# Patient Record
Sex: Female | Born: 1960 | Race: White | Hispanic: No | Marital: Married | Smoking: Never smoker
Health system: Southern US, Community
[De-identification: ages and names within clinical notes are randomized; demographics above are authoritative.]

## PROBLEM LIST (undated history)

## (undated) DIAGNOSIS — M5030 Other cervical disc degeneration, unspecified cervical region: Secondary | ICD-10-CM

## (undated) DIAGNOSIS — E785 Hyperlipidemia, unspecified: Secondary | ICD-10-CM

## (undated) DIAGNOSIS — R002 Palpitations: Secondary | ICD-10-CM

## (undated) DIAGNOSIS — G894 Chronic pain syndrome: Secondary | ICD-10-CM

## (undated) DIAGNOSIS — K589 Irritable bowel syndrome without diarrhea: Secondary | ICD-10-CM

## (undated) DIAGNOSIS — Z872 Personal history of diseases of the skin and subcutaneous tissue: Secondary | ICD-10-CM

## (undated) DIAGNOSIS — F419 Anxiety disorder, unspecified: Secondary | ICD-10-CM

## (undated) DIAGNOSIS — R1310 Dysphagia, unspecified: Secondary | ICD-10-CM

## (undated) DIAGNOSIS — R0609 Other forms of dyspnea: Secondary | ICD-10-CM

## (undated) DIAGNOSIS — F411 Generalized anxiety disorder: Secondary | ICD-10-CM

## (undated) DIAGNOSIS — N200 Calculus of kidney: Secondary | ICD-10-CM

## (undated) DIAGNOSIS — F32A Depression, unspecified: Secondary | ICD-10-CM

## (undated) DIAGNOSIS — L501 Idiopathic urticaria: Secondary | ICD-10-CM

## (undated) DIAGNOSIS — K219 Gastro-esophageal reflux disease without esophagitis: Secondary | ICD-10-CM

## (undated) DIAGNOSIS — K921 Melena: Secondary | ICD-10-CM

## (undated) DIAGNOSIS — R51 Headache: Secondary | ICD-10-CM

## (undated) DIAGNOSIS — K317 Polyp of stomach and duodenum: Secondary | ICD-10-CM

## (undated) DIAGNOSIS — D509 Iron deficiency anemia, unspecified: Secondary | ICD-10-CM

## (undated) DIAGNOSIS — E88819 Insulin resistance, unspecified: Secondary | ICD-10-CM

## (undated) DIAGNOSIS — M1990 Unspecified osteoarthritis, unspecified site: Secondary | ICD-10-CM

## (undated) DIAGNOSIS — N390 Urinary tract infection, site not specified: Secondary | ICD-10-CM

## (undated) DIAGNOSIS — G4700 Insomnia, unspecified: Secondary | ICD-10-CM

## (undated) DIAGNOSIS — IMO0002 Reserved for concepts with insufficient information to code with codable children: Secondary | ICD-10-CM

## (undated) DIAGNOSIS — F329 Major depressive disorder, single episode, unspecified: Secondary | ICD-10-CM

## (undated) DIAGNOSIS — E538 Deficiency of other specified B group vitamins: Secondary | ICD-10-CM

## (undated) DIAGNOSIS — I499 Cardiac arrhythmia, unspecified: Secondary | ICD-10-CM

## (undated) DIAGNOSIS — G9050 Complex regional pain syndrome I, unspecified: Secondary | ICD-10-CM

## (undated) DIAGNOSIS — T50905A Adverse effect of unspecified drugs, medicaments and biological substances, initial encounter: Secondary | ICD-10-CM

## (undated) DIAGNOSIS — K769 Liver disease, unspecified: Secondary | ICD-10-CM

## (undated) DIAGNOSIS — E041 Nontoxic single thyroid nodule: Secondary | ICD-10-CM

## (undated) DIAGNOSIS — R519 Headache, unspecified: Secondary | ICD-10-CM

## (undated) DIAGNOSIS — E8881 Metabolic syndrome: Secondary | ICD-10-CM

## (undated) DIAGNOSIS — F4312 Post-traumatic stress disorder, chronic: Secondary | ICD-10-CM

## (undated) DIAGNOSIS — Z8541 Personal history of malignant neoplasm of cervix uteri: Secondary | ICD-10-CM

## (undated) HISTORY — DX: Melena: K92.1

## (undated) HISTORY — DX: Unspecified osteoarthritis, unspecified site: M19.90

## (undated) HISTORY — DX: Anxiety disorder, unspecified: F41.9

## (undated) HISTORY — PX: ABDOMINAL HYSTERECTOMY: SHX81

## (undated) HISTORY — DX: Metabolic syndrome: E88.81

## (undated) HISTORY — DX: Iron deficiency anemia, unspecified: D50.9

## (undated) HISTORY — DX: Deficiency of other specified B group vitamins: E53.8

## (undated) HISTORY — DX: Adverse effect of unspecified drugs, medicaments and biological substances, initial encounter: T50.905A

## (undated) HISTORY — DX: Generalized anxiety disorder: F41.1

## (undated) HISTORY — DX: Idiopathic urticaria: L50.1

## (undated) HISTORY — DX: Insulin resistance, unspecified: E88.819

## (undated) HISTORY — DX: Reserved for concepts with insufficient information to code with codable children: IMO0002

## (undated) HISTORY — DX: Other cervical disc degeneration, unspecified cervical region: M50.30

## (undated) HISTORY — DX: Dysphagia, unspecified: R13.10

## (undated) HISTORY — DX: Headache, unspecified: R51.9

## (undated) HISTORY — DX: Other forms of dyspnea: R06.09

## (undated) HISTORY — PX: UMBILICAL HERNIA REPAIR: SHX196

## (undated) HISTORY — DX: Urinary tract infection, site not specified: N39.0

## (undated) HISTORY — DX: Personal history of diseases of the skin and subcutaneous tissue: Z87.2

## (undated) HISTORY — PX: CHOLECYSTECTOMY: SHX55

## (undated) HISTORY — DX: Post-traumatic stress disorder, chronic: F43.12

## (undated) HISTORY — DX: Chronic pain syndrome: G89.4

## (undated) HISTORY — DX: Palpitations: R00.2

## (undated) HISTORY — DX: Polyp of stomach and duodenum: K31.7

## (undated) HISTORY — PX: CARDIOVASCULAR STRESS TEST: SHX262

## (undated) HISTORY — PX: NASAL SINUS SURGERY: SHX719

## (undated) HISTORY — DX: Hyperlipidemia, unspecified: E78.5

## (undated) HISTORY — DX: Major depressive disorder, single episode, unspecified: F32.9

## (undated) HISTORY — PX: TONSILLECTOMY: SUR1361

## (undated) HISTORY — DX: Headache: R51

## (undated) HISTORY — DX: Calculus of kidney: N20.0

## (undated) HISTORY — DX: Nontoxic single thyroid nodule: E04.1

## (undated) HISTORY — DX: Gastro-esophageal reflux disease without esophagitis: K21.9

## (undated) HISTORY — DX: Irritable bowel syndrome, unspecified: K58.9

## (undated) HISTORY — DX: Insomnia, unspecified: G47.00

## (undated) HISTORY — DX: Depression, unspecified: F32.A

---
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
## 2010-09-07 HISTORY — PX: COLONOSCOPY: SHX174

## 2011-04-08 HISTORY — PX: OTHER SURGICAL HISTORY: SHX169

## 2013-10-26 DIAGNOSIS — R197 Diarrhea, unspecified: Secondary | ICD-10-CM | POA: Insufficient documentation

## 2014-03-15 LAB — LIPID PANEL
Cholesterol: 315 mg/dL — AB (ref 0–200)
HDL: 45 mg/dL (ref 35–70)
LDL CALC: 222 mg/dL
Triglycerides: 239 mg/dL — AB (ref 40–160)

## 2014-03-15 LAB — CBC AND DIFFERENTIAL
HCT: 41 % (ref 36–46)
Hemoglobin: 12.9 g/dL (ref 12.0–16.0)
Neutrophils Absolute: 1790 /uL
Platelets: 330 10*3/uL (ref 150–399)
WBC: 4.8 10^3/mL

## 2014-03-15 LAB — BASIC METABOLIC PANEL
BUN: 13 mg/dL (ref 4–21)
Creatinine: 0.7 mg/dL (ref <=1.1)
Potassium: 4.2 mmol/L (ref 3.4–5.3)
Sodium: 137 mmol/L (ref 137–147)

## 2014-03-15 LAB — HEPATIC FUNCTION PANEL
ALK PHOS: 105 U/L (ref 25–125)
ALT: 26 U/L (ref 7–35)
AST: 21 U/L (ref 13–35)
BILIRUBIN, TOTAL: 0.4 mg/dL

## 2016-04-07 LAB — HM MAMMOGRAPHY

## 2016-10-26 ENCOUNTER — Ambulatory Visit (INDEPENDENT_AMBULATORY_CARE_PROVIDER_SITE_OTHER): Payer: 59 | Admitting: Family Medicine

## 2016-10-26 ENCOUNTER — Encounter: Payer: Self-pay | Admitting: Family Medicine

## 2016-10-26 VITALS — BP 129/78 | HR 78 | Temp 99.2°F | Resp 16 | Ht 67.0 in | Wt 183.0 lb

## 2016-10-26 DIAGNOSIS — R300 Dysuria: Secondary | ICD-10-CM

## 2016-10-26 DIAGNOSIS — Z634 Disappearance and death of family member: Secondary | ICD-10-CM | POA: Diagnosis not present

## 2016-10-26 DIAGNOSIS — F4329 Adjustment disorder with other symptoms: Secondary | ICD-10-CM

## 2016-10-26 DIAGNOSIS — F4321 Adjustment disorder with depressed mood: Secondary | ICD-10-CM

## 2016-10-26 DIAGNOSIS — N390 Urinary tract infection, site not specified: Secondary | ICD-10-CM

## 2016-10-26 LAB — POCT URINALYSIS DIPSTICK
Bilirubin, UA: NEGATIVE
Blood, UA: NEGATIVE
GLUCOSE UA: NEGATIVE
KETONES UA: NEGATIVE
Leukocytes, UA: NEGATIVE
Nitrite, UA: NEGATIVE
Protein, UA: NEGATIVE
SPEC GRAV UA: 1.015
Urobilinogen, UA: 1
pH, UA: 5.5

## 2016-10-26 MED ORDER — CIPROFLOXACIN HCL 500 MG PO TABS: 500.0000 mg | ORAL_TABLET | Freq: Two times a day (BID) | ORAL | 0 refills | Status: AC

## 2016-10-26 NOTE — Progress Notes (Signed)
Pre visit review using our clinic review tool, if applicable. No additional management support is needed unless otherwise documented below in the visit note. 

## 2016-10-26 NOTE — Progress Notes (Signed)
Office Note 10/26/2016  CC:  Chief Complaint  Patient presents with  . Establish Care  . Urinary Tract Infection    back pain, burning when urinating, urinary frequency, urinary retention x 1 week    HPI:  Tracy Olsen is a 56 y.o. female who is here to establish care and discuss urinary complaints. Patient's most recent primary MD: Dr. Margart Sickles in [place], [place].  Dr. Liz Malady, neurologist in [place]. Dr. Harl Bowie, cardiologist.  Old records were not reviewed prior to or during today's visit. Her primary chronic issue is complex regional pain syndrome.  She says she cannot tolerate any narcotic pain meds, so her neurologist has her on cymbalta and neurontin for this.    For about 1 week: dysuria, feeling of incomplete emptying, diffuse low back pain.  Mild urinary urgency. Says these are her usual UTI sx's. No vag d/c.  No abd pain.  No fever.  Also, pt says her father committed suicide 2 yrs ago and she found him.  She is still dealing with grief regarding this and asks for referral to counselor.  Past Medical History:  Diagnosis Date  . Arthritis   . Complex regional pain syndrome    onset with broken R wrist; pain in both arms  . Depression   . Frequent headaches    migraine synd  . GERD (gastroesophageal reflux disease)   . Hyperlipidemia   . Nephrolithiasis    never had to have one extracted or lithotripsy  . Palpitations    PVCs: no medical therpay recommended by her cardiologist.  . Recurrent UTI    approx 3 per year.    Past Surgical History:  Procedure Laterality Date  . ABDOMINAL HYSTERECTOMY  age 32   endometriosis; also history of "severely abnormal pap"  . CESAREAN SECTION    . CHOLECYSTECTOMY  age 71  . NASAL SINUS SURGERY  age 57  . TONSILLECTOMY  age 43  . UMBILICAL HERNIA REPAIR  age 22    Family History  Problem Relation Age of Onset  . Arthritis Mother   . Rheum arthritis Mother   . Heart disease Mother   . Hypertension  Mother   . Kidney disease Mother   . Raynaud syndrome Mother   . Multiple sclerosis Mother   . Lupus Mother   . Prostate cancer Father   . Heart disease Maternal Uncle   . Hypertension Maternal Uncle   . Hyperlipidemia Maternal Uncle   . Arthritis Maternal Grandmother   . Rheum arthritis Maternal Grandmother   . Kidney disease Maternal Grandmother   . Breast cancer Maternal Grandmother   . Heart disease Maternal Grandfather   . Diabetes Maternal Grandfather   . Heart disease Paternal Grandmother   . Hyperlipidemia Paternal Grandmother   . Kidney disease Maternal Uncle   . Prostate cancer Maternal Uncle     Social History   Social History  . Marital status: Married    Spouse name: N/A  . Number of children: N/A  . Years of education: N/A   Occupational History  . Not on file.   Social History Main Topics  . Smoking status: Never Smoker  . Smokeless tobacco: Never Used  . Alcohol use Yes     Comment: rarely  . Drug use: No  . Sexual activity: Not on file   Other Topics Concern  . Not on file   Social History Narrative   Married, 2 children (adults)   Educ: 4 yrs college.  Occ: housewife.   No tob/rarely alcohol.          Outpatient Encounter Prescriptions as of 10/26/2016  Medication Sig  . dexlansoprazole (DEXILANT) 60 MG capsule Take 60 mg by mouth daily.  . DULoxetine (CYMBALTA) 60 MG capsule Take 60 mg by mouth daily.  Marland Kitchen escitalopram (LEXAPRO) 10 MG tablet Take 10 mg by mouth daily.  Marland Kitchen gabapentin (NEURONTIN) 300 MG capsule Take 300 mg by mouth as needed.  Marland Kitchen LORazepam (ATIVAN) 2 MG tablet Take 2 mg by mouth at bedtime.  . simvastatin (ZOCOR) 20 MG tablet Take 20 mg by mouth daily.  . ciprofloxacin (CIPRO) 500 MG tablet Take 1 tablet (500 mg total) by mouth 2 (two) times daily.   No facility-administered encounter medications on file as of 10/26/2016.     Allergies  Allergen Reactions  . Codeine Anaphylaxis, Hives and Swelling  . Erythromycin Hives  and Swelling    Pt states that she can take zpak  . Penicillins Hives  . Sulfa Antibiotics Hives and Swelling  . Tramadol Hives   ROS Review of Systems  Constitutional: Negative for fatigue and fever.  HENT: Negative for congestion and sore throat.   Eyes: Negative for visual disturbance.  Respiratory: Negative for cough.   Cardiovascular: Negative for chest pain.  Gastrointestinal: Negative for abdominal pain and nausea.  Genitourinary:       See hpi  Musculoskeletal: Negative for back pain and joint swelling.  Skin: Negative for rash.  Neurological: Negative for weakness and headaches.  Hematological: Negative for adenopathy.  Psychiatric/Behavioral: Positive for dysphoric mood (see hpi). The patient is nervous/anxious (see hpi).     PE; Blood pressure 129/78, pulse 78, temperature 99.2 F (37.3 C), temperature source Oral, resp. rate 16, height 5\' 7"  (1.702 m), weight 183 lb (83 kg), SpO2 98 %.  Pt examined with Starla Link, CMA, as chaperone.  Gen: Alert, well appearing.  Patient is oriented to person, place, time, and situation. AFFECT: pleasant, lucid thought and speech. [id number]: no injection, icteris, swelling, or exudate.  EOMI, PERRLA. Mouth: lips without lesion/swelling.  Oral mucosa pink and moist. Oropharynx without erythema, exudate, or swelling.  Neck - No masses or thyromegaly or limitation in range of motion CV: RRR, no m/r/g.   LUNGS: CTA bilat, nonlabored resps, good aeration in all lung fields. ABD: soft, NT, ND, BS normal EXT: no clubbing, cyanosis, or edema.   Pertinent labs:  CC UA today: normal  ASSESSMENT AND PLAN:   New pt; obtain prior PCP and her neurologist's records.  1) UTI suspected: her urinalysis was normal, but she is adamant that these are her usual UTI sx's. Will start cipro 500 mg bid x 5d and sent urine for c/s.  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  Per her request today, will refer to counselor (psychologist Doroteo Glassman).  An After Visit Summary was printed and given to the patient.  Return in about 3 months (around 01/23/2017) for annual CPE (fasting).  Signed:  Crissie Sickles, MD           10/26/2016

## 2016-10-27 LAB — URINE CULTURE: ORGANISM ID, BACTERIA: NO GROWTH

## 2016-11-02 ENCOUNTER — Encounter: Payer: Self-pay | Admitting: Family Medicine

## 2016-11-02 ENCOUNTER — Telehealth: Payer: Self-pay | Admitting: *Deleted

## 2016-11-02 NOTE — Telephone Encounter (Signed)
Noted  

## 2016-11-02 NOTE — Telephone Encounter (Signed)
Received fax from [facility].  Records is on Dr. Isla Pence desk.

## 2016-11-06 ENCOUNTER — Encounter: Payer: Self-pay | Admitting: Family Medicine

## 2016-11-08 ENCOUNTER — Encounter: Payer: Self-pay | Admitting: Family Medicine

## 2016-12-28 ENCOUNTER — Encounter: Payer: 59 | Admitting: Family Medicine

## 2017-01-19 ENCOUNTER — Encounter: Payer: Self-pay | Admitting: *Deleted

## 2017-01-19 DIAGNOSIS — F32A Depression, unspecified: Secondary | ICD-10-CM | POA: Insufficient documentation

## 2017-01-19 DIAGNOSIS — F329 Major depressive disorder, single episode, unspecified: Secondary | ICD-10-CM | POA: Insufficient documentation

## 2017-01-20 ENCOUNTER — Encounter: Payer: Self-pay | Admitting: Family Medicine

## 2017-01-20 ENCOUNTER — Ambulatory Visit (INDEPENDENT_AMBULATORY_CARE_PROVIDER_SITE_OTHER): Payer: 59 | Admitting: Family Medicine

## 2017-01-20 VITALS — BP 106/72 | HR 81 | Temp 98.6°F | Resp 16 | Ht 67.0 in | Wt 189.2 lb

## 2017-01-20 DIAGNOSIS — R5382 Chronic fatigue, unspecified: Secondary | ICD-10-CM

## 2017-01-20 DIAGNOSIS — F32A Depression, unspecified: Secondary | ICD-10-CM

## 2017-01-20 DIAGNOSIS — F419 Anxiety disorder, unspecified: Secondary | ICD-10-CM | POA: Diagnosis not present

## 2017-01-20 DIAGNOSIS — Z9189 Other specified personal risk factors, not elsewhere classified: Secondary | ICD-10-CM | POA: Diagnosis not present

## 2017-01-20 DIAGNOSIS — G90511 Complex regional pain syndrome I of right upper limb: Secondary | ICD-10-CM

## 2017-01-20 DIAGNOSIS — E7800 Pure hypercholesterolemia, unspecified: Secondary | ICD-10-CM | POA: Diagnosis not present

## 2017-01-20 DIAGNOSIS — F5105 Insomnia due to other mental disorder: Secondary | ICD-10-CM

## 2017-01-20 DIAGNOSIS — F329 Major depressive disorder, single episode, unspecified: Secondary | ICD-10-CM | POA: Diagnosis not present

## 2017-01-20 DIAGNOSIS — Z1159 Encounter for screening for other viral diseases: Secondary | ICD-10-CM | POA: Diagnosis not present

## 2017-01-20 LAB — CBC WITH DIFFERENTIAL/PLATELET
BASOS PCT: 2.2 % (ref 0.0–3.0)
Basophils Absolute: 0.1 10*3/uL (ref 0.0–0.1)
EOS ABS: 0.4 10*3/uL (ref 0.0–0.7)
Eosinophils Relative: 7.2 % — ABNORMAL HIGH (ref 0.0–5.0)
HEMATOCRIT: 37.9 % (ref 36.0–46.0)
Hemoglobin: 12.4 g/dL (ref 12.0–15.0)
LYMPHS PCT: 46.8 % — AB (ref 12.0–46.0)
Lymphs Abs: 2.5 10*3/uL (ref 0.7–4.0)
MCHC: 32.8 g/dL (ref 30.0–36.0)
MCV: 81.7 fl (ref 78.0–100.0)
MONOS PCT: 8.9 % (ref 3.0–12.0)
Monocytes Absolute: 0.5 10*3/uL (ref 0.1–1.0)
NEUTROS ABS: 1.8 10*3/uL (ref 1.4–7.7)
Neutrophils Relative %: 34.9 % — ABNORMAL LOW (ref 43.0–77.0)
PLATELETS: 411 10*3/uL — AB (ref 150.0–400.0)
RBC: 4.65 Mil/uL (ref 3.87–5.11)
RDW: 16 % — AB (ref 11.5–15.5)
WBC: 5.3 10*3/uL (ref 4.0–10.5)

## 2017-01-20 LAB — COMPREHENSIVE METABOLIC PANEL
ALK PHOS: 88 U/L (ref 39–117)
ALT: 25 U/L (ref 0–35)
AST: 23 U/L (ref 0–37)
Albumin: 4.4 g/dL (ref 3.5–5.2)
BILIRUBIN TOTAL: 0.3 mg/dL (ref 0.2–1.2)
BUN: 14 mg/dL (ref 6–23)
CHLORIDE: 108 meq/L (ref 96–112)
CO2: 26 mEq/L (ref 19–32)
Calcium: 9.7 mg/dL (ref 8.4–10.5)
Creatinine, Ser: 0.63 mg/dL (ref 0.40–1.20)
GFR: 103.92 mL/min (ref >60.00)
Glucose, Bld: 96 mg/dL (ref 70–99)
Potassium: 4.2 mEq/L (ref 3.5–5.1)
SODIUM: 141 meq/L (ref 135–145)
Total Protein: 6.6 g/dL (ref 6.0–8.3)

## 2017-01-20 LAB — HEMOGLOBIN A1C: Hgb A1c MFr Bld: 5.9 % (ref 4.6–6.5)

## 2017-01-20 LAB — LIPID PANEL
Cholesterol: 201 mg/dL — ABNORMAL HIGH (ref 0–200)
HDL: 49.7 mg/dL (ref >39.00)
LDL CALC: 132 mg/dL — AB (ref 0–99)
NONHDL: 151.54
Total CHOL/HDL Ratio: 4
Triglycerides: 100 mg/dL (ref 0.0–149.0)
VLDL: 20 mg/dL (ref 0.0–40.0)

## 2017-01-20 LAB — TSH: TSH: 1.26 u[IU]/mL (ref 0.35–4.50)

## 2017-01-20 MED ORDER — SIMVASTATIN 20 MG PO TABS
20.0000 mg | ORAL_TABLET | Freq: Every day | ORAL | 6 refills | Status: DC
Start: 2017-01-20 — End: 2017-11-10

## 2017-01-20 NOTE — Patient Instructions (Signed)
Increase your gabapentin to 300 mg in the morning, 300 mg in the early afternoon, and 600 mg at bedtime.  Sorry you are feeling so bad. I'll be praying for your mom.

## 2017-01-20 NOTE — Progress Notes (Signed)
OFFICE VISIT  01/20/2017   CC:  Chief Complaint  Patient presents with  . Fatigue  . Follow-up    RCI, pt is fasting, would like to have labs done   HPI:    Patient is a 56 y.o. female who presents for fatigue. Fatigue is "rediculous", worse the last month or so since mom ill/at [facility], having much worse sx's of her CRPS, having HAs, feels very stressed.  Interestingly she says her depression is not any worse--but this is still a significant problem. She is staying in [place] with her children.  Spends a lot of time in hospital with her mom--about 6 hours.  Hands hurt bad when she pushes her wheelchair--she attributes this to her CRPS. She recently came back home to give herself a break. Taking all meds as prescribed.   She self increased her neurontin to 400-500 gabapentin qhs.  She feels no side effects from this med. Has noted no improvement in the 3 wks on increased dose.  She did get established with Theodosia Paling, counselor, but has not been able to f/u due to her issues with her mom.  Pt snores ever since sinus surgery she had, but no apneic events in sleep have been witnessed. No excessive daytime sleepiness.  Feels exhausted but can't sleep. + Polydipsia but no polyuria.  Past Medical History:  Diagnosis Date  . Arthritis   . Chronic post-traumatic stress disorder (PTSD)    + persistent complicated bereavement disorder (Dr. Doroteo Glassman, PhD, psychology).  . Complex regional pain syndrome    onset with broken R wrist; pain in both arms  . Depression    anx and dep  . Frequent headaches    migraine synd  . GERD (gastroesophageal reflux disease)   . History of cellulitis    with abscess or oral soft tissues  . Hyperlipidemia   . IBS (irritable bowel syndrome)    Diarrhea  . Insomnia   . Insulin resistance   . Nephrolithiasis    never had to have one extracted or lithotripsy  . Palpitations    PVCs: no medical therpay recommended by her cardiologist in [place],  [place].  Marland Kitchen Recurrent UTI    approx 3 per year.    Past Surgical History:  Procedure Laterality Date  . ABDOMINAL HYSTERECTOMY  age 50   endometriosis; also history of "severely abnormal pap"  . CESAREAN SECTION     X 2  . CHOLECYSTECTOMY  age 19  . COLONOSCOPY  2012   Normal  . LE venous doppler U/s  04/2011   No DVT  . NASAL SINUS SURGERY  age 4  . TONSILLECTOMY  age 62  . UMBILICAL HERNIA REPAIR  age 37    Outpatient Medications Prior to Visit  Medication Sig Dispense Refill  . dexlansoprazole (DEXILANT) 60 MG capsule Take 60 mg by mouth daily.    . DULoxetine (CYMBALTA) 60 MG capsule Take 60 mg by mouth daily.    Marland Kitchen LORazepam (ATIVAN) 2 MG tablet Take 2 mg by mouth at bedtime.    . simvastatin (ZOCOR) 20 MG tablet Take 20 mg by mouth daily.    Marland Kitchen escitalopram (LEXAPRO) 10 MG tablet Take 10 mg by mouth daily.    Marland Kitchen gabapentin (NEURONTIN) 300 MG capsule Take 300 mg by mouth as needed.     No facility-administered medications prior to visit.     Allergies  Allergen Reactions  . Codeine Anaphylaxis, Hives and Swelling  . Erythromycin Hives and Swelling  Pt states that she can take zpak  . Penicillins Hives  . Percocet [Oxycodone-Acetaminophen] Other (See Comments)    unknown  . Sulfa Antibiotics Hives and Swelling  . Tramadol Hives  . Vicodin [Hydrocodone-Acetaminophen] Other (See Comments)    unknown    ROS As per HPI  PE: Blood pressure 106/72, pulse 81, temperature 98.6 F (37 C), temperature source Oral, resp. rate 16, height 5\' 7"  (1.702 m), weight 189 lb 4 oz (85.8 kg), SpO2 95 %. Body mass index is 29.64 kg/m.  Gen: Alert, well appearing.  Patient is oriented to person, place, time, and situation. AFFECT: pleasant but slightly flat, lucid thought and speech. GNF:AOZH: no injection, icteris, swelling, or exudate.  EOMI, PERRLA. Mouth: lips without lesion/swelling.  Oral mucosa pink and moist. Oropharynx without erythema, exudate, or swelling.  CV: RRR, no  m/r/g.   LUNGS: CTA bilat, nonlabored resps, good aeration in all lung fields. EXT: no clubbing, cyanosis, or edema.  MUSCULO: mild diffuse TTP in soft tissues of back, also bilat shoulders, and R arm diffusely. Also TTP in both wrists and hands.  No joint swelling, erythema, or warmth. No rash.  No TTP of hips or lower extremities.  LABS:   Lab Results  Component Value Date   WBC 4.8 03/15/2014   HGB 12.9 03/15/2014   HCT 41 03/15/2014   PLT 330 03/15/2014   Lab Results  Component Value Date   CREATININE 0.7 03/15/2014   BUN 13 03/15/2014   NA 137 03/15/2014   K 4.2 03/15/2014   Lab Results  Component Value Date   ALT 26 03/15/2014   AST 21 03/15/2014   ALKPHOS 105 03/15/2014   Lab Results  Component Value Date   CHOL 315 (A) 03/15/2014   Lab Results  Component Value Date   HDL 45 03/15/2014   Lab Results  Component Value Date   LDLCALC 222 03/15/2014   Lab Results  Component Value Date   TRIG 239 (A) 03/15/2014    IMPRESSION AND PLAN:  Fatigue: multifactorial (anxiety, depression, worsened chronic complex regional pain syndrome symptoms in shoulder and arm, insomnia). Plan: increase gabapentin to 300 mg qAM, 300 mg q afternoon, and 600 mg qhs. Hopefully this will help some with insomnia.  Her dose of ativan at bedtime is already maxed out. CBC, CMET, TSH, HbA1c, Hep C screening today. She'll consider PT in future once her life settles down and her mom's health is improved.  An After Visit Summary was printed and given to the patient.  Spent 25 min with pt today, with >50% of this time spent in counseling and care coordination regarding the above problems.  FOLLOW UP: Return in about 4 weeks (around 02/17/2017) for f/u fatigue, CRPS, insomnia.  Signed:  Crissie Sickles, MD           01/20/2017

## 2017-01-21 ENCOUNTER — Encounter: Payer: Self-pay | Admitting: *Deleted

## 2017-01-21 LAB — HEPATITIS C ANTIBODY: HCV AB: NEGATIVE

## 2017-02-08 ENCOUNTER — Other Ambulatory Visit: Payer: Self-pay | Admitting: Family Medicine

## 2017-02-08 MED ORDER — LORAZEPAM 2 MG PO TABS: 2.0000 mg | ORAL_TABLET | Freq: Every day | ORAL | 5 refills | Status: DC

## 2017-02-08 NOTE — Telephone Encounter (Signed)
Patient notified that prescription was faxed to pharmacy. Verbalized understanding.

## 2017-02-08 NOTE — Telephone Encounter (Signed)
RF request for lorazepam LOV: 01/20/17 Next ov: None Last written: unknown, new pt  Please advise. Thanks.   _______________________________  Suzzanne Cloud at [facility] and he stated that pt still has refills for her simvastatin. Pt will need to contact pharmacy for refills.

## 2017-02-08 NOTE — Telephone Encounter (Signed)
Spoke with patient, explained to her that Dr. Anitra Lauth has to approve medication before being sent to pharmacy. Patient was informed that we would call her as soon as decision was made.

## 2017-02-08 NOTE — Telephone Encounter (Signed)
Patient called back to get Lorazepam Rx. Please call her

## 2017-02-08 NOTE — Telephone Encounter (Signed)
Patient requesting refills of the following:  simvastatin (ZOCOR) 20 MG tablet  LORazepam (ATIVAN) 2 MG tablet  Patient will be leaving tomorrow early tomorrow morning heading to [facility] to help take care of her mother.  She needs to pick scripts up from pharmacy today or early tomorrow morning if possible.  Pharmacy:  [id number] - [place], [place] [phone] (Phone) [phone] (Fax)

## 2017-02-09 ENCOUNTER — Telehealth: Payer: Self-pay | Admitting: Family Medicine

## 2017-02-09 MED ORDER — LORAZEPAM 2 MG PO TABS: ORAL_TABLET | ORAL | 5 refills | Status: DC

## 2017-02-09 NOTE — Telephone Encounter (Signed)
Patient states that she is taking lorazepam 2mg  2 tabs at bedtime not just one tab so quantity should be # 60.  Can this please be changed?  Please advise.

## 2017-02-09 NOTE — Telephone Encounter (Signed)
Patient notified via phone call that correction was made and pharmacy was notified as well.

## 2017-02-09 NOTE — Telephone Encounter (Signed)
Reviewed  controlled substance reporting system and the patient's report is accurate/compatible with past rx's.  Most recently dispensed 01/09/17, rx'd by NP Amy Hopkins at her prior PCP office in [place], [place]. Will change rx to lorazepam 2mg , 2 tabs po qhs, #60, RF x 5.  Signed:  Crissie Sickles, MD           02/09/2017

## 2017-03-16 ENCOUNTER — Ambulatory Visit: Payer: 59 | Admitting: Family Medicine

## 2017-03-18 ENCOUNTER — Encounter: Payer: Self-pay | Admitting: Family Medicine

## 2017-03-18 ENCOUNTER — Ambulatory Visit (INDEPENDENT_AMBULATORY_CARE_PROVIDER_SITE_OTHER): Payer: 59 | Admitting: Family Medicine

## 2017-03-18 VITALS — BP 114/74 | HR 81 | Temp 98.1°F | Resp 16 | Ht 67.0 in | Wt 189.5 lb

## 2017-03-18 DIAGNOSIS — Z9189 Other specified personal risk factors, not elsewhere classified: Secondary | ICD-10-CM

## 2017-03-18 DIAGNOSIS — B882 Other arthropod infestations: Secondary | ICD-10-CM | POA: Diagnosis not present

## 2017-03-18 MED ORDER — DOXYCYCLINE HYCLATE 100 MG PO CAPS: 100.0000 mg | ORAL_CAPSULE | Freq: Two times a day (BID) | ORAL | 0 refills | Status: AC

## 2017-03-18 NOTE — Progress Notes (Signed)
OFFICE VISIT  03/18/2017   CC:  Chief Complaint  Patient presents with  . Tick Removal    has had joint pain and has not been feeling well   HPI:    Patient is a 56 y.o.  female who presents for recent tick bite.  Unknown tick type. Found tick 3 days ago, unsure how long it had been there. About the same time she noted achy diffusely in muscles and joints, fatigue, some groin soreness, some HAs, no fevers or rash at tick bite site or elsewhere.  The confounding factor here is that she feels many of these sx's (pain and fatigue) on chronic basis---but she feels like they were worse than normal.  ROS: no n/v/d, no ST or URI sx's, no cough.  No abd pain.  NO CP, palpitations, or dizziness.  Past Medical History:  Diagnosis Date  . Arthritis   . Chronic post-traumatic stress disorder (PTSD)    + persistent complicated bereavement disorder (Dr. Doroteo Glassman, PhD, psychology).  . Complex regional pain syndrome    onset with broken R wrist; pain in both arms  . Depression    anx and dep  . Frequent headaches    migraine synd  . GERD (gastroesophageal reflux disease)   . History of cellulitis    with abscess or oral soft tissues  . Hyperlipidemia   . IBS (irritable bowel syndrome)    Diarrhea  . Insomnia   . Insulin resistance   . Nephrolithiasis    never had to have one extracted or lithotripsy  . Palpitations    PVCs: no medical therpay recommended by her cardiologist in [place], [place].  Marland Kitchen Recurrent UTI    approx 3 per year.    Past Surgical History:  Procedure Laterality Date  . ABDOMINAL HYSTERECTOMY  age 25   endometriosis; also history of "severely abnormal pap"  . CESAREAN SECTION     X 2  . CHOLECYSTECTOMY  age 57  . COLONOSCOPY  2012   Normal  . LE venous doppler U/s  04/2011   No DVT  . NASAL SINUS SURGERY  age 36  . TONSILLECTOMY  age 85  . UMBILICAL HERNIA REPAIR  age 74    Outpatient Medications Prior to Visit  Medication Sig Dispense Refill  .  dexlansoprazole (DEXILANT) 60 MG capsule Take 60 mg by mouth daily.    . DULoxetine (CYMBALTA) 60 MG capsule Take 60 mg by mouth daily.    Marland Kitchen gabapentin (NEURONTIN) 100 MG capsule Take 1-2 capsules by mouth every 8 (eight) hours as needed.  3  . LORazepam (ATIVAN) 2 MG tablet 2 tabs po qhs 60 tablet 5  . simvastatin (ZOCOR) 20 MG tablet Take 1 tablet (20 mg total) by mouth daily. 30 tablet 6   No facility-administered medications prior to visit.     Allergies  Allergen Reactions  . Codeine Anaphylaxis, Hives and Swelling  . Erythromycin Hives and Swelling    Pt states that she can take zpak  . Penicillins Hives  . Percocet [Oxycodone-Acetaminophen] Other (See Comments)    unknown  . Sulfa Antibiotics Hives and Swelling  . Tramadol Hives  . Vicodin [Hydrocodone-Acetaminophen] Other (See Comments)    unknown    ROS As per HPI  PE: Blood pressure 114/74, pulse 81, temperature 98.1 F (36.7 C), temperature source Oral, resp. rate 16, height 5\' 7"  (1.702 m), weight 189 lb 8 oz (86 kg), SpO2 98 %. Gen: Alert, well appearing.  Patient is oriented  to person, place, time, and situation. AFFECT: pleasant, lucid thought and speech. JKK:XFGH: no injection, icteris, swelling, or exudate.  EOMI, PERRLA. Mouth: lips without lesion/swelling.  Oral mucosa pink and moist. Oropharynx without erythema, exudate, or swelling.  Neck - No masses or thyromegaly or limitation in range of motion.  NO tenderness. CV: RRR, no m/r/g.   LUNGS: CTA bilat, nonlabored resps, good aeration in all lung fields. EXT: no clubbing, cyanosis, or edema.  MUSC: mild TTP in legs diffusely but no tenderness to palpation elsewhere. No joint erythema or swelling. SKIN: no rash.  R lateral hip region with 1-2 cm pink macular lesion at the site of the tick bite c/w normal tick bite localized reaction to tick saliva.  LABS:    Chemistry      Component Value Date/Time   NA 141 01/20/2017 1004   NA 137 03/15/2014   K 4.2  01/20/2017 1004   CL 108 01/20/2017 1004   CO2 26 01/20/2017 1004   BUN 14 01/20/2017 1004   BUN 13 03/15/2014   CREATININE 0.63 01/20/2017 1004      Component Value Date/Time   CALCIUM 9.7 01/20/2017 1004   ALKPHOS 88 01/20/2017 1004   AST 23 01/20/2017 1004   ALT 25 01/20/2017 1004   BILITOT 0.3 01/20/2017 1004      IMPRESSION AND PLAN:  Tick bite, possible tick-borne illness. Doxycycline 100 mg bid x 10d. Signs/symptoms to call or return for were reviewed and pt expressed understanding.  An After Visit Summary was printed and given to the patient.  FOLLOW UP: Return if symptoms worsen or fail to improve.  Signed:  Crissie Sickles, MD           03/18/2017

## 2017-03-25 ENCOUNTER — Other Ambulatory Visit: Payer: Self-pay | Admitting: *Deleted

## 2017-03-25 NOTE — Telephone Encounter (Signed)
At her visit in May I increased her gabapentin to 300mg  qAM, 300 mg mid day, and 600 mg qhs. Did she do this? She was supposed to f/u 4 wks after that visit in May. Let me know about the gabapentin dosing.-thx

## 2017-03-25 NOTE — Telephone Encounter (Signed)
[facility].  RF request for gabapentin LOV: 01/20/17 Next ov: None Last written: unknown

## 2017-03-26 ENCOUNTER — Other Ambulatory Visit: Payer: Self-pay | Admitting: Family Medicine

## 2017-03-26 MED ORDER — GABAPENTIN 300 MG PO CAPS: 300.0000 mg | ORAL_CAPSULE | Freq: Two times a day (BID) | ORAL | 1 refills | Status: DC

## 2017-03-26 NOTE — Telephone Encounter (Signed)
SW pt and she stated that she wasn't able to take the high dose. She stated that she has been using the 100mg  capsules and taking 3 in the morning and 3 at bedtime. She stated that if you want to change the capsule to 300mg  she is okay with that. Please advise.Thanks.

## 2017-04-06 ENCOUNTER — Ambulatory Visit: Payer: 59 | Admitting: Family Medicine

## 2017-04-07 ENCOUNTER — Other Ambulatory Visit: Payer: Self-pay | Admitting: Family Medicine

## 2017-04-07 ENCOUNTER — Encounter: Payer: Self-pay | Admitting: Family Medicine

## 2017-04-07 ENCOUNTER — Ambulatory Visit (INDEPENDENT_AMBULATORY_CARE_PROVIDER_SITE_OTHER): Payer: 59 | Admitting: Family Medicine

## 2017-04-07 VITALS — BP 123/84 | HR 80 | Temp 98.4°F | Resp 16 | Ht 67.0 in | Wt 191.2 lb

## 2017-04-07 DIAGNOSIS — M2550 Pain in unspecified joint: Secondary | ICD-10-CM

## 2017-04-07 DIAGNOSIS — M791 Myalgia, unspecified site: Secondary | ICD-10-CM

## 2017-04-07 DIAGNOSIS — R5382 Chronic fatigue, unspecified: Secondary | ICD-10-CM

## 2017-04-07 DIAGNOSIS — M25551 Pain in right hip: Secondary | ICD-10-CM | POA: Diagnosis not present

## 2017-04-07 DIAGNOSIS — B882 Other arthropod infestations: Secondary | ICD-10-CM | POA: Diagnosis not present

## 2017-04-07 MED ORDER — MELOXICAM 15 MG PO TABS: 15.0000 mg | ORAL_TABLET | Freq: Every day | ORAL | 0 refills | Status: DC

## 2017-04-07 NOTE — Progress Notes (Signed)
OFFICE VISIT  04/07/2017   CC:  Chief Complaint  Patient presents with  . groin pain   HPI:    Patient is a 56 y.o.  female who presents for groin pain. "I'm falling apart".   Says that since the tick bite a few weeks ago she continues to have diffuse body pain--myalgias, R groin/hip pain, knee pain bilat, low back pain, very tired all the time.  No rash or fevers.  A couple of sores on tongue but not sure when these occurred. Eating and drinking w/out problem.  BMs w/out blood.  No melena.  No dysuria, urgency, or frequency. Patient points at anterior aspect of top of R hip region as biggest area of pain.  Hip pain worse with ambulation/wt bearing.  Hurts in knees and ankles but no redness or swelling. HA's more than usual.  NO eye redness, vision changes, or focal weakness.  No ST or cough.  I rx'd her doxycycline x 10d for possible tick borne dz when I saw her 03/18/17.  She tried naproxen x 1 dose and it did not help so she did not repeat this med.   Past Medical History:  Diagnosis Date  . Arthritis   . Chronic post-traumatic stress disorder (PTSD)    + persistent complicated bereavement disorder (Dr. Doroteo Glassman, PhD, psychology).  . Complex regional pain syndrome    onset with broken R wrist; pain in both arms  . Depression    anx and dep  . Frequent headaches    migraine synd  . GERD (gastroesophageal reflux disease)   . History of cellulitis    with abscess or oral soft tissues  . Hyperlipidemia   . IBS (irritable bowel syndrome)    Diarrhea  . Insomnia   . Insulin resistance   . Nephrolithiasis    never had to have one extracted or lithotripsy  . Palpitations    PVCs: no medical therpay recommended by her cardiologist in [place], [place].  Marland Kitchen Recurrent UTI    approx 3 per year.    Past Surgical History:  Procedure Laterality Date  . ABDOMINAL HYSTERECTOMY  age 21   endometriosis; also history of "severely abnormal pap"  . CESAREAN SECTION     X 2  .  CHOLECYSTECTOMY  age 40  . COLONOSCOPY  2012   Normal  . LE venous doppler U/s  04/2011   No DVT  . NASAL SINUS SURGERY  age 78  . TONSILLECTOMY  age 37  . UMBILICAL HERNIA REPAIR  age 64    Outpatient Medications Prior to Visit  Medication Sig Dispense Refill  . dexlansoprazole (DEXILANT) 60 MG capsule Take 60 mg by mouth daily.    . DULoxetine (CYMBALTA) 60 MG capsule Take 60 mg by mouth daily.    Marland Kitchen gabapentin (NEURONTIN) 300 MG capsule Take 1 capsule (300 mg total) by mouth 2 (two) times daily. 180 capsule 1  . LORazepam (ATIVAN) 2 MG tablet 2 tabs po qhs 60 tablet 5  . simvastatin (ZOCOR) 20 MG tablet Take 1 tablet (20 mg total) by mouth daily. 30 tablet 6   No facility-administered medications prior to visit.     Allergies  Allergen Reactions  . Codeine Anaphylaxis, Hives and Swelling  . Erythromycin Hives and Swelling    Pt states that she can take zpak  . Penicillins Hives  . Percocet [Oxycodone-Acetaminophen] Other (See Comments)    unknown  . Sulfa Antibiotics Hives and Swelling  . Tramadol Hives  .  Vicodin [Hydrocodone-Acetaminophen] Other (See Comments)    unknown    ROS As per HPI  PE: Blood pressure 123/84, pulse 80, temperature 98.4 F (36.9 C), temperature source Oral, resp. rate 16, height 5' 7"  (1.702 m), weight 191 lb 4 oz (86.8 kg), SpO2 99 %.  Pt examined with Sharen Hones, CMA, as chaperone.  Gen: Alert, well appearing.  Patient is oriented to person, place, time, and situation. AFFECT: pleasant, lucid thought and speech. HER:DEYC: no injection, icteris, swelling, or exudate.  EOMI, PERRLA. Mouth: lips without lesion/swelling.  Oral mucosa pink and moist. Oropharynx without erythema, exudate, or swelling.  Neck - No masses or thyromegaly or limitation in range of motion CV: RRR, no m/r/g.   LUNGS: CTA bilat, nonlabored resps, good aeration in all lung fields. ABD: soft, some RLQ TTP and this pain extends into R hip region.  No guarding or  rebound.  Remainder of abd nontender. ND, BS normal.  No hepatospenomegaly or mass.  No bruits. EXT: no clubbing, cyanosis, or edema.  MUSC: mild TTP of all muscle regions diffusely.  No joint tenderness except diffuse TTP of R hip.  No joint swelling or erythema.  ROM of all joints intact, but R hip pain noted with resisted extension, flexion, aDDuction, and IR.   Skin - no sores or suspicious lesions or rashes or color changes  LABS:  Lab Results  Component Value Date   WBC 5.3 01/20/2017   HGB 12.4 01/20/2017   HCT 37.9 01/20/2017   MCV 81.7 01/20/2017   PLT 411.0 (H) 01/20/2017     Chemistry      Component Value Date/Time   NA 141 01/20/2017 1004   NA 137 03/15/2014   K 4.2 01/20/2017 1004   CL 108 01/20/2017 1004   CO2 26 01/20/2017 1004   BUN 14 01/20/2017 1004   BUN 13 03/15/2014   CREATININE 0.63 01/20/2017 1004      Component Value Date/Time   CALCIUM 9.7 01/20/2017 1004   ALKPHOS 88 01/20/2017 1004   AST 23 01/20/2017 1004   ALT 25 01/20/2017 1004   BILITOT 0.3 01/20/2017 1004     Lab Results  Component Value Date   TSH 1.26 01/20/2017   Lab Results  Component Value Date   CHOL 201 (H) 01/20/2017   HDL 49.70 01/20/2017   LDLCALC 132 (H) 01/20/2017   TRIG 100.0 01/20/2017   CHOLHDL 4 01/20/2017   Lab Results  Component Value Date   HGBA1C 5.9 01/20/2017   IMPRESSION AND PLAN:  Widespread body pain, worst area being R hip---and this is the only area of her body with objective findings. She asked a few times if I thought this was her complex regional pain syndrome "moving around".  I said I didn't think that this disorder would move around but she says her neurologist at her former home town told her it could and says he told her that hers has. At any rate, she may have an acute R hip arthritis.  Will check R hip plain film to start. Also, given her complaints and recent tick bite, will check some labs: Lyme ab, RMSF ab's, Ehrlichia ab's, parvo X44 ab's,  ESR, CRP, CBC, and CMET. Start meloxicam 69m once daily.  An After Visit Summary was printed and given to the patient.  FOLLOW UP: Return in about 2 weeks (around 04/21/2017) for f/u pain.  Signed:  PCrissie Sickles MD           04/07/2017

## 2017-04-08 ENCOUNTER — Ambulatory Visit (HOSPITAL_BASED_OUTPATIENT_CLINIC_OR_DEPARTMENT_OTHER)
Admission: RE | Admit: 2017-04-08 | Discharge: 2017-04-08 | Disposition: A | Discharge status: Home / Self Care | Payer: 59 | Source: Ambulatory Visit | Attending: Family Medicine | Admitting: Family Medicine

## 2017-04-08 DIAGNOSIS — M25551 Pain in right hip: Secondary | ICD-10-CM | POA: Insufficient documentation

## 2017-04-08 LAB — COMPREHENSIVE METABOLIC PANEL
ALT: 21 U/L (ref 0–35)
AST: 21 U/L (ref 0–37)
Albumin: 4.4 g/dL (ref 3.5–5.2)
Alkaline Phosphatase: 97 U/L (ref 39–117)
BILIRUBIN TOTAL: 0.3 mg/dL (ref 0.2–1.2)
BUN: 11 mg/dL (ref 6–23)
CALCIUM: 9.4 mg/dL (ref 8.4–10.5)
CHLORIDE: 104 meq/L (ref 96–112)
CO2: 29 mEq/L (ref 19–32)
CREATININE: 0.68 mg/dL (ref 0.40–1.20)
GFR: 95.08 mL/min (ref >60.00)
GLUCOSE: 81 mg/dL (ref 70–99)
Potassium: 4.1 mEq/L (ref 3.5–5.1)
SODIUM: 138 meq/L (ref 135–145)
Total Protein: 6.7 g/dL (ref 6.0–8.3)

## 2017-04-08 LAB — CBC WITH DIFFERENTIAL/PLATELET
BASOS ABS: 0.1 10*3/uL (ref 0.0–0.1)
Basophils Relative: 1 % (ref 0.0–3.0)
EOS ABS: 0.3 10*3/uL (ref 0.0–0.7)
Eosinophils Relative: 5.8 % — ABNORMAL HIGH (ref 0.0–5.0)
HEMATOCRIT: 38.2 % (ref 36.0–46.0)
Hemoglobin: 12.1 g/dL (ref 12.0–15.0)
LYMPHS PCT: 41.7 % (ref 12.0–46.0)
Lymphs Abs: 2.3 10*3/uL (ref 0.7–4.0)
MCHC: 31.8 g/dL (ref 30.0–36.0)
MCV: 82.2 fl (ref 78.0–100.0)
MONO ABS: 0.5 10*3/uL (ref 0.1–1.0)
Monocytes Relative: 9.2 % (ref 3.0–12.0)
NEUTROS ABS: 2.4 10*3/uL (ref 1.4–7.7)
Neutrophils Relative %: 42.3 % — ABNORMAL LOW (ref 43.0–77.0)
Platelets: 407 10*3/uL — ABNORMAL HIGH (ref 150.0–400.0)
RBC: 4.65 Mil/uL (ref 3.87–5.11)
RDW: 14.6 % (ref 11.5–15.5)
WBC: 5.6 10*3/uL (ref 4.0–10.5)

## 2017-04-08 LAB — ROCKY MTN SPOTTED FVR ABS PNL(IGG+IGM)
RMSF IgG: NOT DETECTED
RMSF IgM: NOT DETECTED

## 2017-04-08 LAB — C-REACTIVE PROTEIN: CRP: 0.1 mg/dL — ABNORMAL LOW (ref 0.5–20.0)

## 2017-04-08 LAB — SEDIMENTATION RATE: Sed Rate: 5 mm/hr (ref 0–30)

## 2017-04-08 LAB — LYME AB/WESTERN BLOT REFLEX: B burgdorferi Ab IgG+IgM: <0.9 Index (ref <0.90)

## 2017-04-10 LAB — EHRLICHIA ANTIBODY PANEL

## 2017-04-12 LAB — PARVOVIRUS B19 ANTIBODY, IGG AND IGM
PAROVIRUS B19 IGG ABS: 8 — AB (ref <0.9)
PAROVIRUS B19 IGM ABS: 0.2 (ref <0.9)

## 2017-04-21 ENCOUNTER — Ambulatory Visit: Payer: 59 | Admitting: Family Medicine

## 2017-04-26 ENCOUNTER — Other Ambulatory Visit: Payer: Self-pay | Admitting: Family Medicine

## 2017-04-26 MED ORDER — DEXLANSOPRAZOLE 60 MG PO CPDR: 60.0000 mg | DELAYED_RELEASE_CAPSULE | Freq: Every day | ORAL | 1 refills | Status: DC

## 2017-04-26 NOTE — Telephone Encounter (Signed)
Rx sent. Left detailed message on cell vm, okay per DPR.

## 2017-04-26 NOTE — Telephone Encounter (Signed)
Patient requesting refill of dexilant.  Patient states the pharmacy sent request on Friday without response. No request found.  Patient has been out over the weekend.  RX okay to send # 90?

## 2017-06-08 DIAGNOSIS — M542 Cervicalgia: Secondary | ICD-10-CM | POA: Diagnosis not present

## 2017-06-08 DIAGNOSIS — Z79899 Other long term (current) drug therapy: Secondary | ICD-10-CM | POA: Diagnosis not present

## 2017-06-08 DIAGNOSIS — M79601 Pain in right arm: Secondary | ICD-10-CM | POA: Diagnosis not present

## 2017-06-08 DIAGNOSIS — M6289 Other specified disorders of muscle: Secondary | ICD-10-CM | POA: Diagnosis not present

## 2017-06-08 DIAGNOSIS — Z5181 Encounter for therapeutic drug level monitoring: Secondary | ICD-10-CM | POA: Diagnosis not present

## 2017-06-16 DIAGNOSIS — M4722 Other spondylosis with radiculopathy, cervical region: Secondary | ICD-10-CM | POA: Diagnosis not present

## 2017-06-16 DIAGNOSIS — M4316 Spondylolisthesis, lumbar region: Secondary | ICD-10-CM | POA: Diagnosis not present

## 2017-07-20 ENCOUNTER — Other Ambulatory Visit: Payer: Self-pay | Admitting: Family Medicine

## 2017-07-20 NOTE — Telephone Encounter (Signed)
Patient requesting RF of dexilant.  Patient requesting 90 day supply to be sent to [facility].    Please contact patient when it is filled.

## 2017-07-20 NOTE — Telephone Encounter (Signed)
[facility] and he stated that pt does have a refill on file.   Pt advised and voiced understanding.

## 2017-07-27 DIAGNOSIS — M47812 Spondylosis without myelopathy or radiculopathy, cervical region: Secondary | ICD-10-CM | POA: Diagnosis not present

## 2017-08-05 ENCOUNTER — Encounter: Payer: Self-pay | Admitting: Podiatry

## 2017-08-05 ENCOUNTER — Ambulatory Visit (INDEPENDENT_AMBULATORY_CARE_PROVIDER_SITE_OTHER): Payer: 59 | Admitting: Podiatry

## 2017-08-05 ENCOUNTER — Other Ambulatory Visit: Payer: Self-pay | Admitting: Family Medicine

## 2017-08-05 VITALS — BP 131/79 | HR 75 | Resp 16

## 2017-08-05 DIAGNOSIS — M21619 Bunion of unspecified foot: Secondary | ICD-10-CM

## 2017-08-05 DIAGNOSIS — L600 Ingrowing nail: Secondary | ICD-10-CM | POA: Diagnosis not present

## 2017-08-05 MED ORDER — LORAZEPAM 2 MG PO TABS: ORAL_TABLET | ORAL | 2 refills | Status: DC

## 2017-08-05 NOTE — Telephone Encounter (Signed)
Copied from [place]. Topic: Quick Communication - See Telephone Encounter >> Aug 05, 2017  2:32 PM Burnis Medin, NT wrote: CRM for notification. See Telephone encounter for: Pt is calling in [facility] [place]  [facility] [place] . Pt needs a refill on LORazepam (ATIVAN) 2 MG tablet.  08/05/17.

## 2017-08-05 NOTE — Telephone Encounter (Signed)
I'll RF this med x 1 mo, with 2 additional RF's. Pls have pt make o/v in Feb 2019 for f/u of her anxiety and insomnia---this is required by new controlled substance prescribing guidelines (need to see q 6 mo for being on lorazepam).-thx

## 2017-08-05 NOTE — Progress Notes (Signed)
Subjective:   Patient ID: Tracy Olsen, female   DOB: 56 y.o.   MRN: [id number]   HPI Patient presents stating that she has had problems with her left big toenail and traumatized it in November and it came off but it still is painful in the more proximal portion.  Patient has a history of chronic pain syndrome of her right and left upper extremity.  Patient is also noted to have structural bunion deformity left over right with redness around the first MPJ   Review of Systems  All other systems reviewed and are negative.       Objective:  Physical Exam  Constitutional: She appears well-developed and well-nourished.  Cardiovascular: Intact distal pulses.  Pulmonary/Chest: Effort normal.  Musculoskeletal: Normal range of motion.  Neurological: She is alert.  Skin: Skin is warm.  Nursing note and vitals reviewed.   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  Patient is found to have a damaged left hallux nail with the proximal one third attached and painful with palpated with no active drainage noted and patient is noted to have no proximal edema erythema or drainage.  Good digital perfusion and well oriented x3.       Assessment:  Traumatized left hallux nail with inflammation fluid buildup around the nailbed that is painful when pressed with no indication of proximal infection.     Plan:  Patient at this point is educated on condition and H&P was performed.  At this point I recommended removal of the proximal nail and I explained procedure to patient and risk.  Patient wants this procedure and at this time I went ahead and infiltrated 60 mg lidocaine Marcaine mixture and under sterile conditions remove the proximal nail flushed the area out did not note any drainage and applied sterile dressing.  Given instructions on soaks and reappoint.  Also talked about her bunions and at this point due to her  chronic pain syndrome I do not recommend treatment I have recommended wider shoes.

## 2017-08-05 NOTE — Telephone Encounter (Signed)
Pt advised and voiced understanding. She stated that she will call back to schedule an apt. Rx faxed.

## 2017-08-05 NOTE — Progress Notes (Signed)
   Subjective:    Patient ID: Tracy Olsen, female    DOB: 1961-06-29, 56 y.o.   MRN: [id number]  HPI    Review of Systems  All other systems reviewed and are negative.      Objective:   Physical Exam        Assessment & Plan:

## 2017-08-05 NOTE — Telephone Encounter (Signed)
RF request for lorazepam LOV: 04/07/17 Next ov: None Last written: 02/09/17 #60 w/ 5RF  Please advise. Thanks.

## 2017-08-05 NOTE — Telephone Encounter (Signed)
Request refill on Lorazepam.

## 2017-08-05 NOTE — Patient Instructions (Signed)

## 2017-08-10 DIAGNOSIS — M47812 Spondylosis without myelopathy or radiculopathy, cervical region: Secondary | ICD-10-CM | POA: Diagnosis not present

## 2017-08-18 DIAGNOSIS — M47812 Spondylosis without myelopathy or radiculopathy, cervical region: Secondary | ICD-10-CM | POA: Insufficient documentation

## 2017-08-26 DIAGNOSIS — K589 Irritable bowel syndrome without diarrhea: Secondary | ICD-10-CM | POA: Diagnosis not present

## 2017-08-26 DIAGNOSIS — M47812 Spondylosis without myelopathy or radiculopathy, cervical region: Secondary | ICD-10-CM | POA: Diagnosis not present

## 2017-08-26 DIAGNOSIS — K219 Gastro-esophageal reflux disease without esophagitis: Secondary | ICD-10-CM | POA: Diagnosis not present

## 2017-09-01 ENCOUNTER — Other Ambulatory Visit: Payer: Self-pay | Admitting: Family Medicine

## 2017-09-01 ENCOUNTER — Telehealth: Payer: Self-pay | Admitting: Family Medicine

## 2017-09-01 NOTE — Telephone Encounter (Signed)
Copied from [place]. Topic: General - Other >> Sep 01, 2017  2:43 PM Tracy Olsen, [place] wrote: Reason for CRM:pt called requesting refill on [facility]apentin [facility] [address]

## 2017-09-01 NOTE — Telephone Encounter (Signed)
Will send request for refill to office since dose changed at 01/20/2017 office visit.

## 2017-09-02 NOTE — Telephone Encounter (Signed)
[facility] I advised pt that she can have this Rx transferred. Pt voiced understanding.

## 2017-09-16 DIAGNOSIS — Z885 Allergy status to narcotic agent status: Secondary | ICD-10-CM | POA: Diagnosis not present

## 2017-09-16 DIAGNOSIS — Z79899 Other long term (current) drug therapy: Secondary | ICD-10-CM | POA: Diagnosis not present

## 2017-09-16 DIAGNOSIS — M47812 Spondylosis without myelopathy or radiculopathy, cervical region: Secondary | ICD-10-CM | POA: Diagnosis not present

## 2017-10-04 DIAGNOSIS — R140 Abdominal distension (gaseous): Secondary | ICD-10-CM | POA: Diagnosis not present

## 2017-10-04 DIAGNOSIS — R197 Diarrhea, unspecified: Secondary | ICD-10-CM | POA: Diagnosis not present

## 2017-10-04 DIAGNOSIS — K921 Melena: Secondary | ICD-10-CM | POA: Diagnosis not present

## 2017-10-05 ENCOUNTER — Encounter: Payer: Self-pay | Admitting: Family Medicine

## 2017-10-07 DIAGNOSIS — L820 Inflamed seborrheic keratosis: Secondary | ICD-10-CM | POA: Diagnosis not present

## 2017-10-07 DIAGNOSIS — D225 Melanocytic nevi of trunk: Secondary | ICD-10-CM | POA: Diagnosis not present

## 2017-10-16 ENCOUNTER — Other Ambulatory Visit: Payer: Self-pay | Admitting: Family Medicine

## 2017-10-18 DIAGNOSIS — M47812 Spondylosis without myelopathy or radiculopathy, cervical region: Secondary | ICD-10-CM | POA: Diagnosis not present

## 2017-10-18 DIAGNOSIS — M549 Dorsalgia, unspecified: Secondary | ICD-10-CM | POA: Diagnosis not present

## 2017-10-18 DIAGNOSIS — G894 Chronic pain syndrome: Secondary | ICD-10-CM | POA: Diagnosis not present

## 2017-11-03 ENCOUNTER — Other Ambulatory Visit: Payer: Self-pay | Admitting: *Deleted

## 2017-11-03 ENCOUNTER — Ambulatory Visit: Payer: 59 | Admitting: Family Medicine

## 2017-11-03 MED ORDER — LORAZEPAM 2 MG PO TABS: ORAL_TABLET | ORAL | 2 refills | Status: DC

## 2017-11-03 NOTE — Telephone Encounter (Signed)
Rx called into pharmacy [facility] [place] (provider left before signing Rx). Pt advised and voiced understanding.

## 2017-11-03 NOTE — Telephone Encounter (Signed)
Patients appt rescheduled due to provider illness patient states she will be out of her lorazepam and is requesting refill to be sent in to get her through till her appt. She is scheduled for 11/10/17. She is requesting a call from [facility] to verify if sent in.

## 2017-11-03 NOTE — Addendum Note (Signed)
Addended by: Onalee Hua on: 11/03/2017 11:48 AM   Modules accepted: Orders

## 2017-11-03 NOTE — Telephone Encounter (Signed)
Please advise. Thanks.  

## 2017-11-10 ENCOUNTER — Telehealth: Payer: Self-pay | Admitting: Family Medicine

## 2017-11-10 ENCOUNTER — Ambulatory Visit: Payer: 59 | Admitting: Family Medicine

## 2017-11-10 MED ORDER — PROMETHAZINE HCL 12.5 MG PO TABS: ORAL_TABLET | ORAL | 1 refills | Status: DC

## 2017-11-10 MED ORDER — SIMVASTATIN 20 MG PO TABS: 20.0000 mg | ORAL_TABLET | Freq: Every day | ORAL | 6 refills | Status: DC

## 2017-11-10 NOTE — Telephone Encounter (Signed)
Pt advised and voiced understanding.   

## 2017-11-10 NOTE — Telephone Encounter (Signed)
Phenergan and simvastatin eRx'd.

## 2017-11-10 NOTE — Telephone Encounter (Signed)
Pt calling to check status on these medications being sent to the pharmacy.

## 2017-11-10 NOTE — Telephone Encounter (Signed)
Copied from [place] [id number]. Topic: Quick Communication - Rx Refill/Question >> Nov 10, 2017  8:52 AM Margot Ables wrote: Medication: pt was supposed to come in this morning. She states she has vertigo and today it is really bad and she is vomiting and cannot make it to her appt. She is requesting a medication be sent in for nausea. She is rescheduled for 11/15/17. Pt took last dose of simvastatin last night and states this was reason for appt (to renew medications). Please send in simvastatin.  Has the patient contacted their pharmacy? Yes.  Pt states simvastatin was denied bc she needed appt Preferred Pharmacy (with phone number or street name): [id number] [place], [place] - [address] [phone] (Phone) [phone] (Fax)

## 2017-11-15 ENCOUNTER — Ambulatory Visit (INDEPENDENT_AMBULATORY_CARE_PROVIDER_SITE_OTHER): Payer: 59 | Admitting: Family Medicine

## 2017-11-15 ENCOUNTER — Encounter: Payer: Self-pay | Admitting: Family Medicine

## 2017-11-15 VITALS — BP 108/70 | HR 84 | Temp 98.6°F | Resp 16 | Ht 67.0 in | Wt 190.8 lb

## 2017-11-15 DIAGNOSIS — R7303 Prediabetes: Secondary | ICD-10-CM

## 2017-11-15 DIAGNOSIS — M2550 Pain in unspecified joint: Secondary | ICD-10-CM

## 2017-11-15 DIAGNOSIS — R5382 Chronic fatigue, unspecified: Secondary | ICD-10-CM | POA: Diagnosis not present

## 2017-11-15 DIAGNOSIS — M791 Myalgia, unspecified site: Secondary | ICD-10-CM

## 2017-11-15 DIAGNOSIS — F329 Major depressive disorder, single episode, unspecified: Secondary | ICD-10-CM

## 2017-11-15 DIAGNOSIS — F32A Depression, unspecified: Secondary | ICD-10-CM

## 2017-11-15 DIAGNOSIS — R1013 Epigastric pain: Secondary | ICD-10-CM

## 2017-11-15 DIAGNOSIS — T50905A Adverse effect of unspecified drugs, medicaments and biological substances, initial encounter: Secondary | ICD-10-CM | POA: Diagnosis not present

## 2017-11-15 DIAGNOSIS — K909 Intestinal malabsorption, unspecified: Secondary | ICD-10-CM | POA: Diagnosis not present

## 2017-11-15 DIAGNOSIS — F419 Anxiety disorder, unspecified: Secondary | ICD-10-CM

## 2017-11-15 DIAGNOSIS — K219 Gastro-esophageal reflux disease without esophagitis: Secondary | ICD-10-CM | POA: Diagnosis not present

## 2017-11-15 DIAGNOSIS — E7800 Pure hypercholesterolemia, unspecified: Secondary | ICD-10-CM

## 2017-11-15 LAB — CBC WITH DIFFERENTIAL/PLATELET
BASOS ABS: 0.1 10*3/uL (ref 0.0–0.1)
Basophils Relative: 2 % (ref 0.0–3.0)
EOS PCT: 8.3 % — AB (ref 0.0–5.0)
Eosinophils Absolute: 0.4 10*3/uL (ref 0.0–0.7)
HCT: 34.7 % — ABNORMAL LOW (ref 36.0–46.0)
Hemoglobin: 11.2 g/dL — ABNORMAL LOW (ref 12.0–15.0)
Lymphocytes Relative: 44.9 % (ref 12.0–46.0)
Lymphs Abs: 2.2 10*3/uL (ref 0.7–4.0)
MCHC: 32.4 g/dL (ref 30.0–36.0)
MCV: 81.1 fl (ref 78.0–100.0)
MONO ABS: 0.3 10*3/uL (ref 0.1–1.0)
MONOS PCT: 7 % (ref 3.0–12.0)
NEUTROS ABS: 1.9 10*3/uL (ref 1.4–7.7)
NEUTROS PCT: 37.8 % — AB (ref 43.0–77.0)
PLATELETS: 426 10*3/uL — AB (ref 150.0–400.0)
RBC: 4.28 Mil/uL (ref 3.87–5.11)
RDW: 16.2 % — ABNORMAL HIGH (ref 11.5–15.5)
WBC: 5 10*3/uL (ref 4.0–10.5)

## 2017-11-15 LAB — COMPREHENSIVE METABOLIC PANEL
ALT: 25 U/L (ref 0–35)
AST: 23 U/L (ref 0–37)
Albumin: 4.3 g/dL (ref 3.5–5.2)
Alkaline Phosphatase: 89 U/L (ref 39–117)
BUN: 8 mg/dL (ref 6–23)
CHLORIDE: 106 meq/L (ref 96–112)
CO2: 27 meq/L (ref 19–32)
Calcium: 9.8 mg/dL (ref 8.4–10.5)
Creatinine, Ser: 0.62 mg/dL (ref 0.40–1.20)
GFR: 105.55 mL/min (ref >60.00)
GLUCOSE: 94 mg/dL (ref 70–99)
POTASSIUM: 4.5 meq/L (ref 3.5–5.1)
SODIUM: 139 meq/L (ref 135–145)
Total Bilirubin: 0.2 mg/dL (ref 0.2–1.2)
Total Protein: 6.5 g/dL (ref 6.0–8.3)

## 2017-11-15 LAB — MAGNESIUM: Magnesium: 2.1 mg/dL (ref 1.5–2.5)

## 2017-11-15 LAB — CK: Total CK: 54 U/L (ref 7–177)

## 2017-11-15 LAB — SEDIMENTATION RATE: Sed Rate: 9 mm/hr (ref 0–30)

## 2017-11-15 MED ORDER — DEXLANSOPRAZOLE 60 MG PO CPDR: 60.0000 mg | DELAYED_RELEASE_CAPSULE | Freq: Every day | ORAL | 1 refills | Status: AC

## 2017-11-15 NOTE — Progress Notes (Deleted)
OFFICE VISIT  11/15/2017   CC:  Chief Complaint  Patient presents with  . Follow-up    RCI,     HPI:    Patient is a 57 y.o. Caucasian female who presents for f/u anx/dep, HLD, insulin resistance.  Has GI complaints of soreness in epigastrum, severe GERD with regurg, lots of indigestion, mild "flare" of postprandial diarrhea + bloating, was put on new med by Dr. Paulita Fujita and she says it made her worse.  She can't recall the name of the med but his notes states "PPI".  Pt wants RF of her dexilant so she can restart this. She is eating gluten free diet, avoiding spicy foods.  Dr. Paulita Fujita suggested FODMAP diet but she found it too restrictive and BMs "dark and like clay".  Avoids dairy for the most part.  Complains of chronic fatigue--24/7 "ever since that tick bite last summer".     Past Medical History:  Diagnosis Date  . Arthritis   . Blood in stool    GI suspects hemorrhoid dz  . Chronic post-traumatic stress disorder (PTSD)    + persistent complicated bereavement disorder (Dr. Doroteo Glassman, PhD, psychology).  . Complex regional pain syndrome    onset with broken R wrist; pain in both arms  . Depression    anx and dep  . Frequent headaches    migraine synd  . GERD (gastroesophageal reflux disease)    w/dysphagia.  Dr. Paulita Fujita seeing if daily PPI makes this better/awaiting outside EGD records, considering barium swallow with tab (as of 10/04/17 o/v)  . History of cellulitis    with abscess or oral soft tissues  . Hyperlipidemia   . IBS (irritable bowel syndrome)    Diarrhea  . Insomnia   . Insulin resistance   . Nephrolithiasis    never had to have one extracted or lithotripsy  . Palpitations    PVCs: no medical therpay recommended by her cardiologist in [place], [place].  Marland Kitchen Recurrent UTI    approx 3 per year.    Past Surgical History:  Procedure Laterality Date  . ABDOMINAL HYSTERECTOMY  age 59   endometriosis; also history of "severely abnormal pap"  . CESAREAN  SECTION     X 2  . CHOLECYSTECTOMY  age 46  . COLONOSCOPY  2012   Normal  . LE venous doppler U/s  04/2011   No DVT  . NASAL SINUS SURGERY  age 27  . TONSILLECTOMY  age 47  . UMBILICAL HERNIA REPAIR  age 89    Outpatient Medications Prior to Visit  Medication Sig Dispense Refill  . DEXILANT 60 MG capsule TAKE 1 CAPSULE BY MOUTH EVERY DAY 90 capsule 1  . DULoxetine (CYMBALTA) 60 MG capsule Take 60 mg by mouth daily.    Marland Kitchen gabapentin (NEURONTIN) 300 MG capsule TAKE 1 CAPSULE BY MOUTH TWICE A DAY (Patient taking differently: TAKE 1 CAPSULE BY MOUTH Three daily) 180 capsule 1  . LORazepam (ATIVAN) 2 MG tablet 2 tabs po qhs 60 tablet 2  . promethazine (PHENERGAN) 12.5 MG tablet 1-2 tabs po q6h prn nausea 30 tablet 1  . simvastatin (ZOCOR) 20 MG tablet Take 1 tablet (20 mg total) by mouth daily. 30 tablet 6  . tiZANidine (ZANAFLEX) 4 MG tablet Take 4 mg by mouth once. Muscle relaxant    . meloxicam (MOBIC) 15 MG tablet Take 1 tablet (15 mg total) by mouth daily. (Patient not taking: Reported on 11/15/2017) 30 tablet 0   No facility-administered medications prior  to visit.     Allergies  Allergen Reactions  . Codeine Anaphylaxis, Hives and Swelling  . Fentanyl Itching and Rash  . Meperidine Rash  . Nsaids Rash  . Erythromycin Hives and Swelling    Pt states that she can take zpak  . Penicillins Hives  . Percocet [Oxycodone-Acetaminophen] Other (See Comments)    unknown  . Sulfa Antibiotics Hives and Swelling  . Tramadol Hives  . Vicodin [Hydrocodone-Acetaminophen] Other (See Comments)    unknown    ROS As per HPI  PE: Blood pressure 108/70, pulse 84, temperature 98.6 F (37 C), temperature source Oral, resp. rate 16, height 5\' 7"  (1.702 m), weight 190 lb 12 oz (86.5 kg), SpO2 97 %. Gen: Alert, well appearing.  Patient is oriented to person, place, time, and situation. AFFECT: anxious/melancholy, lucid thought and speech. SHF:WYOV: no injection, icteris, swelling, or exudate.   EOMI, PERRLA. Mouth: lips without lesion/swelling.  Oral mucosa pink and moist. Oropharynx without erythema, exudate, or swelling.  CV: RRR, no m/r/g.   LUNGS: CTA bilat, nonlabored resps, good aeration in all lung fields. EXT: no clubbing, cyanosis, or edema.  Musc: mild diffuse TTP of soft tissues symmetrically in neck, back, upper ext's, lower ext's. No swelling, erythema, or warmth of any joints.  No joint tenderness. SKIN: no rash   LABS:  Lab Results  Component Value Date   TSH 1.26 01/20/2017   Lab Results  Component Value Date   WBC 5.6 04/07/2017   HGB 12.1 04/07/2017   HCT 38.2 04/07/2017   MCV 82.2 04/07/2017   PLT 407.0 (H) 04/07/2017   Lab Results  Component Value Date   CREATININE 0.68 04/07/2017   BUN 11 04/07/2017   NA 138 04/07/2017   K 4.1 04/07/2017   CL 104 04/07/2017   CO2 29 04/07/2017   Lab Results  Component Value Date   ALT 21 04/07/2017   AST 21 04/07/2017   ALKPHOS 97 04/07/2017   BILITOT 0.3 04/07/2017   Lab Results  Component Value Date   CHOL 201 (H) 01/20/2017   Lab Results  Component Value Date   HDL 49.70 01/20/2017   Lab Results  Component Value Date   LDLCALC 132 (H) 01/20/2017   Lab Results  Component Value Date   TRIG 100.0 01/20/2017   Lab Results  Component Value Date   CHOLHDL 4 01/20/2017   Lab Results  Component Value Date   HGBA1C 5.9 01/20/2017    IMPRESSION AND PLAN:  No problem-specific Assessment & Plan notes found for this encounter.  A1c, lipid,BMET  An After Visit Summary was printed and given to the patient.  FOLLOW UP: No Follow-up on file.  Signed:  Crissie Sickles, MD           11/15/2017

## 2017-11-15 NOTE — Patient Instructions (Signed)
Stop your simvastatin (cholesterol med)

## 2017-11-16 LAB — VITAMIN B12: VITAMIN B 12: 188 pg/mL — AB (ref 211–911)

## 2017-11-16 LAB — TSH: TSH: 1.37 u[IU]/mL (ref 0.35–4.50)

## 2017-11-17 ENCOUNTER — Other Ambulatory Visit: Payer: Self-pay | Admitting: *Deleted

## 2017-11-17 ENCOUNTER — Encounter: Payer: Self-pay | Admitting: Family Medicine

## 2017-11-17 ENCOUNTER — Other Ambulatory Visit (INDEPENDENT_AMBULATORY_CARE_PROVIDER_SITE_OTHER): Payer: 59

## 2017-11-17 DIAGNOSIS — D649 Anemia, unspecified: Secondary | ICD-10-CM | POA: Diagnosis not present

## 2017-11-17 DIAGNOSIS — E538 Deficiency of other specified B group vitamins: Secondary | ICD-10-CM

## 2017-11-17 LAB — FERRITIN: Ferritin: 4.8 ng/mL — ABNORMAL LOW (ref 10.0–291.0)

## 2017-11-17 LAB — IBC PANEL
IRON: 21 ug/dL — AB (ref 42–145)
Saturation Ratios: 4.2 % — ABNORMAL LOW (ref 20.0–50.0)
TRANSFERRIN: 359 mg/dL (ref 212.0–360.0)

## 2017-11-17 MED ORDER — CYANOCOBALAMIN 1000 MCG/ML IJ SOLN
1000.0000 ug | INTRAMUSCULAR | Status: AC
  Administered 2017-11-18: 1000 ug via INTRAMUSCULAR

## 2017-11-17 NOTE — Progress Notes (Signed)
OFFICE VISIT  11/22/2017   CC:  Chief Complaint  Patient presents with  . Follow-up    RCI,     HPI:    Patient is a 57 y.o. Caucasian female who presents for f/u anx/dep, HLD, insulin resistance.  However, she spent today's visit reiterating her struggles with chronic fatigue and chronic pain in joints>muscles. Has GI complaints of soreness in epigastrum, severe GERD with regurg, lots of indigestion, mild "flare" of postprandial diarrhea + bloating, was put on new med by Dr. Paulita Fujita and she says it made her worse.  She can't recall the name of the med but his notes states "PPI".  Pt wants RF of her dexilant so she can restart this. She is eating gluten free diet, avoiding spicy foods.  Dr. Paulita Fujita suggested FODMAP diet but she found it too restrictive and BMs "dark and like clay".  Avoids dairy for the most part.  Complains of chronic fatigue--24/7 "ever since that tick bite last summer".  Various joints hurt, pt a bit vague about specific regions and has difficulty stating clearly whether or not her muscles ache as well.  No rash.  She is getting over a recent bout of vertigo.   Past Medical History:  Diagnosis Date  . Arthritis   . Blood in stool    GI suspects hemorrhoid dz  . Chronic post-traumatic stress disorder (PTSD)    + persistent complicated bereavement disorder (Dr. Doroteo Glassman, PhD, psychology).  . Complex regional pain syndrome    onset with broken R wrist; pain in both arms  . Depression    anx and dep  . Frequent headaches    migraine synd  . GERD (gastroesophageal reflux disease)    w/dysphagia.  Dr. Paulita Fujita seeing if daily PPI makes this better/awaiting outside EGD records, considering barium swallow with tab (as of 10/04/17 o/v)  . History of cellulitis    with abscess or oral soft tissues  . Hyperlipidemia   . IBS (irritable bowel syndrome)    Diarrhea  . Insomnia   . Insulin resistance   . Nephrolithiasis    never had to have one extracted or  lithotripsy  . Normocytic anemia 11/2017   MCV borderline low at 81.  Vit B12 low, iron panel pending as of 11/17/17.  Marland Kitchen Palpitations    PVCs: no medical therpay recommended by her cardiologist in [place], [place].  Marland Kitchen Recurrent UTI    approx 3 per year.  . Vitamin B12 deficiency    IF ab pending as of 11/17/17.  IM vit B12 replacement planned.    Past Surgical History:  Procedure Laterality Date  . ABDOMINAL HYSTERECTOMY  age 53   endometriosis; also history of "severely abnormal pap"  . CESAREAN SECTION     X 2  . CHOLECYSTECTOMY  age 88  . COLONOSCOPY  2012   Normal  . LE venous doppler U/s  04/2011   No DVT  . NASAL SINUS SURGERY  age 49  . TONSILLECTOMY  age 89  . UMBILICAL HERNIA REPAIR  age 19    Outpatient Medications Prior to Visit  Medication Sig Dispense Refill  . DULoxetine (CYMBALTA) 60 MG capsule Take 60 mg by mouth daily.    Marland Kitchen gabapentin (NEURONTIN) 300 MG capsule TAKE 1 CAPSULE BY MOUTH TWICE A DAY (Patient taking differently: TAKE 1 CAPSULE BY MOUTH Three daily) 180 capsule 1  . LORazepam (ATIVAN) 2 MG tablet 2 tabs po qhs 60 tablet 2  . promethazine (PHENERGAN) 12.5 MG  tablet 1-2 tabs po q6h prn nausea 30 tablet 1  . tiZANidine (ZANAFLEX) 4 MG tablet Take 4 mg by mouth once. Muscle relaxant    . DEXILANT 60 MG capsule TAKE 1 CAPSULE BY MOUTH EVERY DAY 90 capsule 1  . simvastatin (ZOCOR) 20 MG tablet Take 1 tablet (20 mg total) by mouth daily. 30 tablet 6  . meloxicam (MOBIC) 15 MG tablet Take 1 tablet (15 mg total) by mouth daily. (Patient not taking: Reported on 11/15/2017) 30 tablet 0   No facility-administered medications prior to visit.     Allergies  Allergen Reactions  . Codeine Anaphylaxis, Hives and Swelling  . Fentanyl Itching and Rash  . Meperidine Rash  . Nsaids Rash  . Erythromycin Hives and Swelling    Pt states that she can take zpak  . Penicillins Hives  . Percocet [Oxycodone-Acetaminophen] Other (See Comments)    unknown  . Sulfa  Antibiotics Hives and Swelling  . Tramadol Hives  . Vicodin [Hydrocodone-Acetaminophen] Other (See Comments)    unknown    ROS As per HPI  PE: Blood pressure 108/70, pulse 84, temperature 98.6 F (37 C), temperature source Oral, resp. rate 16, height 5\' 7"  (1.702 m), weight 190 lb 12 oz (86.5 kg), SpO2 97 %. Body mass index is 29.88 kg/m.  Gen: Alert, well appearing.  Patient is oriented to person, place, time, and situation. AFFECT: pleasant, lucid thought and speech. SWF:UXNA: no injection, icteris, swelling, or exudate.  EOMI, PERRLA. Mouth: lips without lesion/swelling.  Oral mucosa pink and moist. Oropharynx without erythema, exudate, or swelling.  Neck - No masses or thyromegaly or limitation in range of motion CV: RRR, no m/r/g.   LUNGS: CTA bilat, nonlabored resps, good aeration in all lung fields. EXT: no clubbing, cyanosis, or edema.  Musculoskeletal: no joint swelling, erythema, warmth, or tenderness.  ROM of all joints intact.   LABS:  Lab Results  Component Value Date   TSH 1.37 11/15/2017   Lab Results  Component Value Date   WBC 5.0 11/15/2017   HGB 11.2 (L) 11/15/2017   HCT 34.7 (L) 11/15/2017   MCV 81.1 11/15/2017   PLT 426.0 (H) 11/15/2017   Lab Results  Component Value Date   CREATININE 0.62 11/15/2017   BUN 8 11/15/2017   NA 139 11/15/2017   K 4.5 11/15/2017   CL 106 11/15/2017   CO2 27 11/15/2017   Lab Results  Component Value Date   ALT 25 11/15/2017   AST 23 11/15/2017   ALKPHOS 89 11/15/2017   BILITOT 0.2 11/15/2017   Lab Results  Component Value Date   CHOL 201 (H) 01/20/2017   Lab Results  Component Value Date   HDL 49.70 01/20/2017   Lab Results  Component Value Date   LDLCALC 132 (H) 01/20/2017   Lab Results  Component Value Date   TRIG 100.0 01/20/2017   Lab Results  Component Value Date   CHOLHDL 4 01/20/2017   Lab Results  Component Value Date   HGBA1C 5.9 01/20/2017    IMPRESSION AND PLAN:   1) Insulin  resistance/prediabetes: continue to encourage dietary modification and increased exercise. Checking random glucose today.  2) Hypercholesterolemia: on statin but we need to r/o this med as cause of her arthralgias/fatigue.  She has been intolerant of crestor (with same sx's but more intense) in the past. Stop simvastatin for at least 73mo.  3) Chronic depression/anxiety: not ideally controlled but fairly stable. No signif discussion of this today due to  focus on other issues.    4) Chronic fatigue and myalgias: postviral vs idiopathic.   No sign of rheum/connective tissue dz. Check CK total, CBC, CMET, CRP, Vit B12, magnesium levels.  5) GERD with dyspepsia: pt states she prefers to go back on dexilant 60mg  qd so I rx'd this today. She is encouraged to keep approp f/u with Dr. Paulita Fujita, her GI MD.  6) BPPV: recent "flare" resolving.  An After Visit Summary was printed and given to the patient.  FOLLOW UP: Return in about 4 weeks (around 12/13/2017) for f/u fatigue/arthralgias.  Signed:  Crissie Sickles, MD           11/22/2017

## 2017-11-18 ENCOUNTER — Ambulatory Visit (INDEPENDENT_AMBULATORY_CARE_PROVIDER_SITE_OTHER): Payer: 59 | Admitting: Family Medicine

## 2017-11-18 DIAGNOSIS — E538 Deficiency of other specified B group vitamins: Secondary | ICD-10-CM | POA: Diagnosis not present

## 2017-11-18 NOTE — Progress Notes (Addendum)
Patient presented to office today for first of weekly series of B12 injection per Dr Idelle Leech lab result notes dated 3.11.19.   Patient tolerated well and wanted placement in upper outer quadrant.    Medical screening examination/treatment/procedure(s) were performed by non-physician practitioner and as supervising physician I was immediately available for consultation/collaboration.  I agree with above assessment and plan.  Electronically Signed by: Howard Pouch, DO [facility]

## 2017-11-25 ENCOUNTER — Ambulatory Visit (INDEPENDENT_AMBULATORY_CARE_PROVIDER_SITE_OTHER): Payer: 59

## 2017-11-25 DIAGNOSIS — E538 Deficiency of other specified B group vitamins: Secondary | ICD-10-CM

## 2017-11-25 MED ORDER — CYANOCOBALAMIN 1000 MCG/ML IJ SOLN
1000.0000 ug | Freq: Once | INTRAMUSCULAR | Status: AC
  Administered 2017-11-25: 1000 ug via INTRAMUSCULAR

## 2017-11-25 NOTE — Progress Notes (Signed)
Pt presented to our department for her B12 nurse Visit Injection. Pt tolerated Meds Well

## 2017-11-28 NOTE — Progress Notes (Signed)
Pt with vit B12 def. Agree with vit B12 1000 mcg IM in office today  Signed:  Crissie Sickles, MD           11/28/2017

## 2017-12-02 ENCOUNTER — Ambulatory Visit (INDEPENDENT_AMBULATORY_CARE_PROVIDER_SITE_OTHER): Payer: 59

## 2017-12-02 DIAGNOSIS — E538 Deficiency of other specified B group vitamins: Secondary | ICD-10-CM

## 2017-12-02 MED ORDER — CYANOCOBALAMIN 1000 MCG/ML IJ SOLN
1000.0000 ug | Freq: Once | INTRAMUSCULAR | Status: AC
  Administered 2017-12-02: 1000 ug via INTRAMUSCULAR

## 2017-12-02 NOTE — Progress Notes (Signed)
Pt presented to our department for her weekly B12 Injection. Pt tolerated meds Well with no complain.

## 2017-12-03 ENCOUNTER — Telehealth: Payer: Self-pay | Admitting: Family Medicine

## 2017-12-03 NOTE — Telephone Encounter (Signed)
OK, will update chart.

## 2017-12-03 NOTE — Telephone Encounter (Unsigned)
Copied from [place] [id number] [phone]. Topic: Quick Communication - See Telephone Encounter >> Dec 03, 2017 11:37 AM Percell Belt A wrote: CRM for notification. See Telephone encounter for: 12/03/17. Pt called in and said that she would like for Dr Anitra Lauth to know she had another reaction to the B12 injection.  She felt that her heart was racing, hot, pins and needle feeling throughout her legs and felt has if it was hard to breathe.  She is not feeling like that this morning just super tired.  She does not want to take these injection anymore because it was worse this time then last time.  She understands if any other  or shortness of breathe she will go to er if needed .  She would like this recorded in her chart

## 2017-12-03 NOTE — Telephone Encounter (Signed)
FYI

## 2017-12-06 DIAGNOSIS — K317 Polyp of stomach and duodenum: Secondary | ICD-10-CM

## 2017-12-06 DIAGNOSIS — D509 Iron deficiency anemia, unspecified: Secondary | ICD-10-CM

## 2017-12-06 HISTORY — DX: Polyp of stomach and duodenum: K31.7

## 2017-12-06 HISTORY — DX: Iron deficiency anemia, unspecified: D50.9

## 2017-12-08 NOTE — Progress Notes (Signed)
Pt with vit B12 deficiency.  Agree with vit B12 1000 mcg IM in office today.  Signed:  Crissie Sickles, MD           12/08/2017

## 2017-12-09 ENCOUNTER — Ambulatory Visit: Payer: 59

## 2017-12-13 ENCOUNTER — Ambulatory Visit: Payer: 59 | Admitting: Family Medicine

## 2017-12-14 DIAGNOSIS — L820 Inflamed seborrheic keratosis: Secondary | ICD-10-CM | POA: Diagnosis not present

## 2017-12-14 DIAGNOSIS — D1801 Hemangioma of skin and subcutaneous tissue: Secondary | ICD-10-CM | POA: Diagnosis not present

## 2017-12-14 DIAGNOSIS — L821 Other seborrheic keratosis: Secondary | ICD-10-CM | POA: Diagnosis not present

## 2017-12-20 ENCOUNTER — Ambulatory Visit: Payer: 59 | Admitting: Family Medicine

## 2017-12-30 ENCOUNTER — Ambulatory Visit: Payer: 59 | Admitting: Family Medicine

## 2017-12-30 NOTE — Progress Notes (Deleted)
OFFICE VISIT  12/30/2017   CC: No chief complaint on file.  HPI:    Patient is a 57 y.o. Caucasian female who presents for 6 week f/u chronic fatigue, arthralgias, and myalgias. Labs showed that she is deficient of vit B12 and she then took once weekly vit B12 injections x 3 consecutive weeks. She felt like she had some SOB and fatigue as reaction to the injections so she has refused to take anymore of these. She was also found to have iron deficiency anemia.  Past Medical History:  Diagnosis Date  . Arthritis   . Blood in stool    GI suspects hemorrhoid dz  . Chronic post-traumatic stress disorder (PTSD)    + persistent complicated bereavement disorder (Dr. Doroteo Glassman, PhD, psychology).  . Complex regional pain syndrome    onset with broken R wrist; pain in both arms  . Depression    anx and dep  . Frequent headaches    migraine synd  . GERD (gastroesophageal reflux disease)    w/dysphagia.  Dr. Paulita Fujita seeing if daily PPI makes this better/awaiting outside EGD records, considering barium swallow with tab (as of 10/04/17 o/v)  . History of cellulitis    with abscess or oral soft tissues  . Hyperlipidemia   . IBS (irritable bowel syndrome)    Diarrhea  . Insomnia   . Insulin resistance   . Nephrolithiasis    never had to have one extracted or lithotripsy  . Normocytic anemia 11/2017   MCV borderline low at 81.  Vit B12 low, iron panel pending as of 11/17/17.  Marland Kitchen Palpitations    PVCs: no medical therpay recommended by her cardiologist in [place], [place].  Marland Kitchen Recurrent UTI    approx 3 per year.  . Vitamin B12 deficiency    IF ab pending as of 11/17/17.  IM vit B12 replacement planned.    Past Surgical History:  Procedure Laterality Date  . ABDOMINAL HYSTERECTOMY  age 32   endometriosis; also history of "severely abnormal pap"  . CESAREAN SECTION     X 2  . CHOLECYSTECTOMY  age 31  . COLONOSCOPY  2012   Normal  . LE venous doppler U/s  04/2011   No DVT  . NASAL SINUS  SURGERY  age 15  . TONSILLECTOMY  age 11  . UMBILICAL HERNIA REPAIR  age 2    Outpatient Medications Prior to Visit  Medication Sig Dispense Refill  . dexlansoprazole (DEXILANT) 60 MG capsule Take 1 capsule (60 mg total) by mouth daily. 90 capsule 1  . DULoxetine (CYMBALTA) 60 MG capsule Take 60 mg by mouth daily.    Marland Kitchen gabapentin (NEURONTIN) 300 MG capsule TAKE 1 CAPSULE BY MOUTH TWICE A DAY (Patient taking differently: TAKE 1 CAPSULE BY MOUTH Three daily) 180 capsule 1  . LORazepam (ATIVAN) 2 MG tablet 2 tabs po qhs 60 tablet 2  . promethazine (PHENERGAN) 12.5 MG tablet 1-2 tabs po q6h prn nausea 30 tablet 1  . tiZANidine (ZANAFLEX) 4 MG tablet Take 4 mg by mouth once. Muscle relaxant     No facility-administered medications prior to visit.     Allergies  Allergen Reactions  . Codeine Anaphylaxis, Hives and Swelling  . Fentanyl Itching and Rash  . Meperidine Rash  . Nsaids Rash  . Erythromycin Hives and Swelling    Pt states that she can take zpak  . Penicillins Hives  . Percocet [Oxycodone-Acetaminophen] Other (See Comments)    unknown  . Sulfa Antibiotics  Hives and Swelling  . Tramadol Hives  . Vicodin [Hydrocodone-Acetaminophen] Other (See Comments)    unknown    ROS As per HPI  PE: There were no vitals taken for this visit. ***  LABS:  Lab Results  Component Value Date   TSH 1.37 11/15/2017   Lab Results  Component Value Date   WBC 5.0 11/15/2017   HGB 11.2 (L) 11/15/2017   HCT 34.7 (L) 11/15/2017   MCV 81.1 11/15/2017   PLT 426.0 (H) 11/15/2017   Lab Results  Component Value Date   IRON 21 (L) 11/17/2017   FERRITIN 4.8 (L) 11/17/2017   Lab Results  Component Value Date   VITAMINB12 188 (L) 11/15/2017    Lab Results  Component Value Date   CREATININE 0.62 11/15/2017   BUN 8 11/15/2017   NA 139 11/15/2017   K 4.5 11/15/2017   CL 106 11/15/2017   CO2 27 11/15/2017   Lab Results  Component Value Date   ALT 25 11/15/2017   AST 23 11/15/2017    ALKPHOS 89 11/15/2017   BILITOT 0.2 11/15/2017   Lab Results  Component Value Date   CHOL 201 (H) 01/20/2017   Lab Results  Component Value Date   HDL 49.70 01/20/2017   Lab Results  Component Value Date   LDLCALC 132 (H) 01/20/2017   Lab Results  Component Value Date   TRIG 100.0 01/20/2017   Lab Results  Component Value Date   CHOLHDL 4 01/20/2017   Lab Results  Component Value Date   HGBA1C 5.9 01/20/2017    IMPRESSION AND PLAN:  No problem-specific Assessment & Plan notes found for this encounter. Normocytic anemia: mixed vit B12 and iron deficiency. Check IF ab.  If neg, will proceed with oral vit B12 replacement.   FOLLOW UP: No follow-ups on file.

## 2018-01-03 DIAGNOSIS — K219 Gastro-esophageal reflux disease without esophagitis: Secondary | ICD-10-CM | POA: Diagnosis not present

## 2018-01-03 DIAGNOSIS — K921 Melena: Secondary | ICD-10-CM | POA: Diagnosis not present

## 2018-01-03 DIAGNOSIS — R1310 Dysphagia, unspecified: Secondary | ICD-10-CM | POA: Diagnosis not present

## 2018-01-05 ENCOUNTER — Encounter: Payer: Self-pay | Admitting: Family Medicine

## 2018-01-05 ENCOUNTER — Ambulatory Visit (INDEPENDENT_AMBULATORY_CARE_PROVIDER_SITE_OTHER): Payer: 59 | Admitting: Family Medicine

## 2018-01-05 VITALS — BP 110/88 | HR 77 | Temp 98.9°F | Resp 16 | Ht 67.0 in | Wt 192.0 lb

## 2018-01-05 DIAGNOSIS — E538 Deficiency of other specified B group vitamins: Secondary | ICD-10-CM | POA: Diagnosis not present

## 2018-01-05 DIAGNOSIS — F419 Anxiety disorder, unspecified: Secondary | ICD-10-CM

## 2018-01-05 DIAGNOSIS — R5382 Chronic fatigue, unspecified: Secondary | ICD-10-CM

## 2018-01-05 DIAGNOSIS — F32A Depression, unspecified: Secondary | ICD-10-CM

## 2018-01-05 DIAGNOSIS — K580 Irritable bowel syndrome with diarrhea: Secondary | ICD-10-CM | POA: Diagnosis not present

## 2018-01-05 DIAGNOSIS — E7800 Pure hypercholesterolemia, unspecified: Secondary | ICD-10-CM | POA: Diagnosis not present

## 2018-01-05 DIAGNOSIS — D508 Other iron deficiency anemias: Secondary | ICD-10-CM | POA: Diagnosis not present

## 2018-01-05 DIAGNOSIS — F329 Major depressive disorder, single episode, unspecified: Secondary | ICD-10-CM

## 2018-01-05 HISTORY — PX: ESOPHAGOGASTRODUODENOSCOPY: SHX1529

## 2018-01-05 LAB — IGA: IGA: 99 mg/dL (ref 68–378)

## 2018-01-05 LAB — CBC
HEMATOCRIT: 34.7 % — AB (ref 36.0–46.0)
Hemoglobin: 11.1 g/dL — ABNORMAL LOW (ref 12.0–15.0)
MCHC: 32 g/dL (ref 30.0–36.0)
MCV: 77.9 fl — AB (ref 78.0–100.0)
Platelets: 408 10*3/uL — ABNORMAL HIGH (ref 150.0–400.0)
RBC: 4.45 Mil/uL (ref 3.87–5.11)
RDW: 16.6 % — AB (ref 11.5–15.5)
WBC: 4.6 10*3/uL (ref 4.0–10.5)

## 2018-01-05 LAB — LIPID PANEL
CHOL/HDL RATIO: 4
Cholesterol: 196 mg/dL (ref 0–200)
HDL: 51.5 mg/dL (ref >39.00)
LDL Cholesterol: 122 mg/dL — ABNORMAL HIGH (ref 0–99)
NonHDL: 144.52
Triglycerides: 113 mg/dL (ref 0.0–149.0)
VLDL: 22.6 mg/dL (ref 0.0–40.0)

## 2018-01-05 LAB — VITAMIN B12: Vitamin B-12: 354 pg/mL (ref 211–911)

## 2018-01-05 NOTE — Patient Instructions (Signed)
Buy over the counter generic ferrous sulfate 325mg  tabs and take 1 tab once daily---try to take this with orange juice to increase the absorption of the iron.

## 2018-01-05 NOTE — Progress Notes (Signed)
OFFICE VISIT  01/05/2018   CC:  Chief Complaint  Patient presents with  . Follow-up    arthralgia, fatigue   HPI:    Patient is a 57 y.o. Caucasian female who presents for 6 wk f/u chronic fatigue, polymyalgias, and recent dx of vit B12 def and iron def---mild normocytic anemia. She was able to take weekly vit B12 injections x 3 weeks, most recent 12/02/17 but had reaction to these shots (SOB, heart palpitations, nauseated, dizziness--lasted about 1.2 hrs) so I will recheck some labs today and likely recommend oral vit B12 supplement. She had normocytic anemia and her iron was also quite low.  GERD and IBS-D and intermittent BRBPR, with hemorrhoidal bleeding suspected: pt set with GI (Dr. Paulita Fujita) to get EGD and colonoscopy.  Scheduled for 01/18/18.  Last visit we did blood w/u for her fatigue/myalgias: all normal.  Suspect post-viral fatigue vs idiopathic. We decided to try a statin holiday.  She could not tell any difference off of this med so she will restart it. GERD: I got her back on her dexilant per her request b/c she felt like this was working better for her. HLD: statin holiday last visit as stated above. Chronic anx/dep: no changes in regimen last visit.  She sees a pain mgmt specialist for her complex regional pain syndrome.  Snoring: yes, no apnea though per husband.  ROS: no CP, no SOB, no rash, no eye redness or pain, no melena, no abd pain, no HAs, no dizziness, no palpitations. No polyuria or polydipsia.    Past Medical History:  Diagnosis Date  . Arthritis   . Blood in stool    GI suspects hemorrhoid dz  . Chronic post-traumatic stress disorder (PTSD)    + persistent complicated bereavement disorder (Dr. Doroteo Glassman, PhD, psychology).  . Complex regional pain syndrome    onset with broken R wrist; pain in both arms  . Depression    anx and dep  . Frequent headaches    migraine synd  . GERD (gastroesophageal reflux disease)    w/dysphagia.  Dr. Paulita Fujita seeing  if daily PPI makes this better/awaiting outside EGD records, considering barium swallow with tab (as of 10/04/17 o/v)  . History of cellulitis    with abscess or oral soft tissues  . Hyperlipidemia   . IBS (irritable bowel syndrome)    Diarrhea  . Insomnia   . Insulin resistance   . Nephrolithiasis    never had to have one extracted or lithotripsy  . Normocytic anemia 11/2017   MCV borderline low at 81.  Vit B12 low, iron panel pending as of 11/17/17.  Marland Kitchen Palpitations    PVCs: no medical therpay recommended by her cardiologist in [place], [place].  Marland Kitchen Recurrent UTI    approx 3 per year.  . Vitamin B12 deficiency    IF ab pending as of 11/17/17.  IM vit B12 replacement planned.    Past Surgical History:  Procedure Laterality Date  . ABDOMINAL HYSTERECTOMY  age 89   endometriosis; also history of "severely abnormal pap"  . CESAREAN SECTION     X 2  . CHOLECYSTECTOMY  age 27  . COLONOSCOPY  2012   Normal  . LE venous doppler U/s  04/2011   No DVT  . NASAL SINUS SURGERY  age 55  . TONSILLECTOMY  age 45  . UMBILICAL HERNIA REPAIR  age 42    Outpatient Medications Prior to Visit  Medication Sig Dispense Refill  . dexlansoprazole (DEXILANT) 60  MG capsule Take 1 capsule (60 mg total) by mouth daily. 90 capsule 1  . DULoxetine (CYMBALTA) 60 MG capsule Take 60 mg by mouth daily.    Marland Kitchen gabapentin (NEURONTIN) 600 MG tablet TAKE ONE TABLET (600 MG TOTAL) BY MOUTH 3 (THREE) TIMES A DAY.  2  . LORazepam (ATIVAN) 2 MG tablet 2 tabs po qhs 60 tablet 2  . promethazine (PHENERGAN) 12.5 MG tablet 1-2 tabs po q6h prn nausea 30 tablet 1  . tiZANidine (ZANAFLEX) 4 MG tablet Take 4 mg by mouth once. Muscle relaxant    . gabapentin (NEURONTIN) 300 MG capsule TAKE 1 CAPSULE BY MOUTH TWICE A DAY (Patient not taking: Reported on 01/05/2018) 180 capsule 1  . simvastatin (ZOCOR) 20 MG tablet Take 20 mg by mouth daily.  6   No facility-administered medications prior to visit.     Allergies  Allergen  Reactions  . Codeine Anaphylaxis, Hives and Swelling  . Cyanocobalamin Shortness Of Breath, Palpitations and Other (See Comments)    Weak,nausea, vision disturbance  . Fentanyl Itching and Rash  . Meperidine Rash  . Nsaids Rash  . Erythromycin Hives and Swelling    Pt states that she can take zpak  . Penicillins Hives  . Percocet [Oxycodone-Acetaminophen] Other (See Comments)    unknown  . Sulfa Antibiotics Hives and Swelling  . Tramadol Hives  . Vicodin [Hydrocodone-Acetaminophen] Other (See Comments)    unknown    ROS As per HPI  PE: Blood pressure 110/88, pulse 77, temperature 98.9 F (37.2 C), temperature source Oral, resp. rate 16, height 5\' 7"  (1.702 m), weight 192 lb (87.1 kg), SpO2 96 %. Gen: Alert, well appearing.  Patient is oriented to person, place, time, and situation. AFFECT: pleasant, lucid thought and speech. No further exam today.  LABS:  Lab Results  Component Value Date   TSH 1.37 11/15/2017   Lab Results  Component Value Date   WBC 5.0 11/15/2017   HGB 11.2 (L) 11/15/2017   HCT 34.7 (L) 11/15/2017   MCV 81.1 11/15/2017   PLT 426.0 (H) 11/15/2017   Lab Results  Component Value Date   IRON 21 (L) 11/17/2017   FERRITIN 4.8 (L) 11/17/2017    Lab Results  Component Value Date   CREATININE 0.62 11/15/2017   BUN 8 11/15/2017   NA 139 11/15/2017   K 4.5 11/15/2017   CL 106 11/15/2017   CO2 27 11/15/2017   Lab Results  Component Value Date   ALT 25 11/15/2017   AST 23 11/15/2017   ALKPHOS 89 11/15/2017   BILITOT 0.2 11/15/2017   Lab Results  Component Value Date   CHOL 201 (H) 01/20/2017   Lab Results  Component Value Date   HDL 49.70 01/20/2017   Lab Results  Component Value Date   LDLCALC 132 (H) 01/20/2017   Lab Results  Component Value Date   TRIG 100.0 01/20/2017   Lab Results  Component Value Date   CHOLHDL 4 01/20/2017   Lab Results  Component Value Date   CKTOTAL 54 11/15/2017   Lab Results  Component Value  Date   ESRSEDRATE 9 11/15/2017    Lab Results  Component Value Date   VITAMINB12 188 (L) 11/15/2017    IMPRESSION AND PLAN:  1) Chronic fatigue: suspect some component of rxn to chronic pain, idiopathic fatigue, plus some effect from iron def anemia and vit B12 def.  2) Iron def anemia: GI bleeding vs poor absorption. Start ferrous sulfate 325mg  po qd.  Check TTG, IgA level, CBC no diff. She has GI appt with her GI MD, Dr. Paulita Fujita, to get endoscopies.  3) Vit B12 def: intolerant of B12 injections (received 3 weekly 1000 mcg injections. Check intrinsic factor ab's as well as vit B12 level today. If IF ab's neg will have her start otc 1000 mcg vit B12 tab qd.  4) Hyperlipidemia: seems that simvastatin holiday made no diff in her fatigue. Restart this med, check FLP today.  5) Chronic anx/dep: The current medical regimen is effective;  continue present plan and medications.  An After Visit Summary was printed and given to the patient.  FOLLOW UP: Return in about 3 months (around 04/07/2018) for routine chronic illness f/u (30 min): also lab visit 6 wks recheck cbc/iron/b12.  Signed:  Crissie Sickles, MD           01/05/2018

## 2018-01-06 LAB — TISSUE TRANSGLUTAMINASE ABS,IGG,IGA
(TTG) AB, IGG: 2 U/mL
(tTG) Ab, IgA: 1 U/mL

## 2018-01-07 LAB — INTRINSIC FACTOR ANTIBODIES: INTRINSIC FACTOR: NEGATIVE

## 2018-01-09 ENCOUNTER — Encounter: Payer: Self-pay | Admitting: Family Medicine

## 2018-01-10 ENCOUNTER — Encounter: Payer: Self-pay | Admitting: *Deleted

## 2018-01-18 ENCOUNTER — Encounter: Payer: Self-pay | Admitting: Family Medicine

## 2018-01-18 DIAGNOSIS — K317 Polyp of stomach and duodenum: Secondary | ICD-10-CM | POA: Diagnosis not present

## 2018-01-18 DIAGNOSIS — K3189 Other diseases of stomach and duodenum: Secondary | ICD-10-CM | POA: Diagnosis not present

## 2018-01-18 DIAGNOSIS — K449 Diaphragmatic hernia without obstruction or gangrene: Secondary | ICD-10-CM | POA: Diagnosis not present

## 2018-01-18 DIAGNOSIS — R1310 Dysphagia, unspecified: Secondary | ICD-10-CM | POA: Diagnosis not present

## 2018-01-18 DIAGNOSIS — R197 Diarrhea, unspecified: Secondary | ICD-10-CM | POA: Diagnosis not present

## 2018-01-18 HISTORY — PX: COLONOSCOPY: SHX174

## 2018-01-18 LAB — HM COLONOSCOPY

## 2018-01-21 DIAGNOSIS — M549 Dorsalgia, unspecified: Secondary | ICD-10-CM | POA: Diagnosis not present

## 2018-01-21 DIAGNOSIS — M47812 Spondylosis without myelopathy or radiculopathy, cervical region: Secondary | ICD-10-CM | POA: Diagnosis not present

## 2018-01-21 DIAGNOSIS — G894 Chronic pain syndrome: Secondary | ICD-10-CM | POA: Diagnosis not present

## 2018-02-02 ENCOUNTER — Other Ambulatory Visit: Payer: Self-pay

## 2018-02-03 ENCOUNTER — Telehealth: Payer: Self-pay | Admitting: Family Medicine

## 2018-02-03 ENCOUNTER — Encounter: Payer: Self-pay | Admitting: Family Medicine

## 2018-02-03 MED ORDER — LORAZEPAM 2 MG PO TABS: ORAL_TABLET | ORAL | 5 refills | Status: DC

## 2018-02-03 NOTE — Telephone Encounter (Signed)
Rx faxed to pharmacy  

## 2018-02-03 NOTE — Telephone Encounter (Signed)
Copied from [place] [id number]. Topic: Quick Communication - Rx Refill/Question >> Feb 03, 2018 11:38 AM Yvette Rack wrote: Medication: LORazepam (ATIVAN) 2 MG tablet   pt going out of town today  Has the patient contacted their pharmacy? Yes.  Pharmacy faxed over a request yesterday because pt was going out of town today (Agent: If no, request that the patient contact the pharmacy for the refill.) (Agent: If yes, when and what did the pharmacy advise?)  Preferred Pharmacy (with phone number or street name):     [id number] - [place], [place] - [address] [phone] (Phone) [phone] (Fax)      Agent: Please be advised that RX refills may take up to 3 business days. We ask that you follow-up with your pharmacy.

## 2018-02-03 NOTE — Telephone Encounter (Signed)
Patient notified and verbalized understanding. 

## 2018-02-03 NOTE — Telephone Encounter (Signed)
Pt calling to check on her med refill for Ativan. She is waiting to go out of town.

## 2018-02-09 ENCOUNTER — Ambulatory Visit: Payer: 59 | Admitting: Skilled Nursing Facility1

## 2018-02-10 ENCOUNTER — Encounter: Payer: Self-pay | Admitting: Family Medicine

## 2018-02-11 ENCOUNTER — Other Ambulatory Visit: Payer: 59

## 2018-02-23 ENCOUNTER — Other Ambulatory Visit: Payer: Self-pay | Admitting: Gastroenterology

## 2018-02-23 DIAGNOSIS — R1310 Dysphagia, unspecified: Secondary | ICD-10-CM | POA: Diagnosis not present

## 2018-02-23 DIAGNOSIS — K317 Polyp of stomach and duodenum: Secondary | ICD-10-CM | POA: Diagnosis not present

## 2018-02-23 DIAGNOSIS — K219 Gastro-esophageal reflux disease without esophagitis: Secondary | ICD-10-CM | POA: Diagnosis not present

## 2018-02-25 ENCOUNTER — Encounter: Payer: Self-pay | Admitting: Family Medicine

## 2018-02-25 DIAGNOSIS — K219 Gastro-esophageal reflux disease without esophagitis: Secondary | ICD-10-CM | POA: Diagnosis not present

## 2018-02-25 DIAGNOSIS — K529 Noninfective gastroenteritis and colitis, unspecified: Secondary | ICD-10-CM | POA: Diagnosis not present

## 2018-02-25 DIAGNOSIS — R112 Nausea with vomiting, unspecified: Secondary | ICD-10-CM | POA: Diagnosis not present

## 2018-02-25 DIAGNOSIS — R100 Acute abdomen: Secondary | ICD-10-CM | POA: Diagnosis not present

## 2018-02-25 DIAGNOSIS — R1011 Right upper quadrant pain: Secondary | ICD-10-CM | POA: Diagnosis not present

## 2018-02-28 ENCOUNTER — Encounter (HOSPITAL_COMMUNITY): Admission: RE | Payer: Self-pay | Source: Ambulatory Visit

## 2018-02-28 ENCOUNTER — Ambulatory Visit (HOSPITAL_COMMUNITY): Admission: RE | Admit: 2018-02-28 | Payer: 59 | Source: Ambulatory Visit | Admitting: Gastroenterology

## 2018-02-28 SURGERY — IMPEDANCE PH STUDY, ESOPHAGUS

## 2018-02-28 NOTE — Progress Notes (Signed)
Pt did not show for Esophageal Manometry /Ph testing today. Pt state she had previously cancelled with Dr. Erlinda Hong office . I called Dr. Erlinda Hong office and let Dietrich Pates know that the patient did not show today. The patient was told to call Dr. Erlinda Hong to reschedule the test.

## 2018-03-17 ENCOUNTER — Encounter: Payer: Self-pay | Admitting: Family Medicine

## 2018-03-17 ENCOUNTER — Ambulatory Visit (INDEPENDENT_AMBULATORY_CARE_PROVIDER_SITE_OTHER): Payer: 59 | Admitting: Family Medicine

## 2018-03-17 VITALS — BP 111/75 | HR 78 | Temp 98.3°F | Resp 16 | Ht 67.0 in | Wt 192.2 lb

## 2018-03-17 DIAGNOSIS — J0110 Acute frontal sinusitis, unspecified: Secondary | ICD-10-CM | POA: Diagnosis not present

## 2018-03-17 DIAGNOSIS — M549 Dorsalgia, unspecified: Secondary | ICD-10-CM | POA: Diagnosis not present

## 2018-03-17 DIAGNOSIS — R300 Dysuria: Secondary | ICD-10-CM | POA: Diagnosis not present

## 2018-03-17 DIAGNOSIS — N3000 Acute cystitis without hematuria: Secondary | ICD-10-CM

## 2018-03-17 LAB — POCT URINALYSIS DIPSTICK
Bilirubin, UA: NEGATIVE
Blood, UA: NEGATIVE
Glucose, UA: NEGATIVE
KETONES UA: NEGATIVE
Leukocytes, UA: NEGATIVE
Nitrite, UA: NEGATIVE
PH UA: 6 (ref 5.0–8.0)
Protein, UA: NEGATIVE
SPEC GRAV UA: 1.01 (ref 1.010–1.025)
Urobilinogen, UA: 0.2 E.U./dL

## 2018-03-17 MED ORDER — CIPROFLOXACIN HCL 500 MG PO TABS: ORAL_TABLET | ORAL | 0 refills | Status: DC

## 2018-03-17 NOTE — Progress Notes (Signed)
OFFICE VISIT  03/17/2018   CC:  Chief Complaint  Patient presents with  . Back Pain    with urinary symptoms   HPI:    Patient is a 57 y.o. Caucasian female who presents for feeling lots of pressure in low back region as well as pressure in pelvic region when she urinates , onset about 1 week ago.   Has had long term urinary frequency, but now also feels like not much comes out when she urinates.  Feeling of incomplete emptying.  No dysuria.  No foul urine odor.  No blood in urine.  No radiating groin pain.  Has also had about 1 week of slight increase in nasal congestion, pain in L nostril and L paranasal sinus region. Forehead achy as well.  +PND, some ST, some cough.  No fevers.  These sx's came on right [place] and lots of water went up her nose. She has chronic nausea--no change recently.  No abd pain.  No diarrhea or vag d/c. Recent imaging at [facility] showed osteoarthritis in L spine.  Past Medical History:  Diagnosis Date  . Arthritis   . Blood in stool    EGD and colonoscopy w/out source of bleeding 12/2017-->+ hemorrhoids-->GI suspects hemorrhoid dz as cause of blood in stool.  . Chronic post-traumatic stress disorder (PTSD)    + persistent complicated bereavement disorder (Dr. Doroteo Glassman, PhD, psychology).  . Complex regional pain syndrome    onset with broken R wrist; pain in both arms  . Depression    anx and dep  . Frequent headaches    migraine synd  . GAD (generalized anxiety disorder)   . Gastric polyp 12/2017   Hyperplastic->Dr. outlaw considering removal in hosp after severe GERD fully addressed.  Marland Kitchen GERD (gastroesophageal reflux disease)    w/dysphagia and nausea.  Dr. Paulita Fujita: failed multiple PPIs, EGD normal except small hiatal hernia and 12 mm gastric polyp--48H esoph manometry planned as of 02/23/18  . History of cellulitis    with abscess or oral soft tissues  . Hyperlipidemia   . IBS (irritable bowel syndrome)    Diarrhea  . Insomnia    . Insulin resistance   . Iron deficiency anemia 12/2017   11/2017.  Pt to get EGD/Colonoscopy by Dr. Paulita Fujita 01/2018.  . Nephrolithiasis    never had to have one extracted or lithotripsy  . Palpitations    PVCs: no medical therpay recommended by her cardiologist in [place], [place].  Marland Kitchen Recurrent UTI    approx 3 per year.  . Vitamin B12 deficiency    IF ab neg.  Intol B12 inj.  Started B12 oral 01/2018    Past Surgical History:  Procedure Laterality Date  . ABDOMINAL HYSTERECTOMY  age 69   endometriosis; also history of "severely abnormal pap"  . CESAREAN SECTION     X 2  . CHOLECYSTECTOMY  age 70  . COLONOSCOPY  2012   Normal  . COLONOSCOPY  01/18/2018   Normal: repeat 10 years  . ESOPHAGOGASTRODUODENOSCOPY  01/2018   Hyperplastic gastric polyp-->GI (Outlaw) considering polyp removal in hosp.  . LE venous doppler U/s  04/2011   No DVT  . NASAL SINUS SURGERY  age 49  . TONSILLECTOMY  age 1  . UMBILICAL HERNIA REPAIR  age 40    Outpatient Medications Prior to Visit  Medication Sig Dispense Refill  . dexlansoprazole (DEXILANT) 60 MG capsule Take 1 capsule (60 mg total) by mouth daily. 90 capsule  1  . DULoxetine (CYMBALTA) 60 MG capsule Take 60 mg by mouth daily.    Marland Kitchen gabapentin (NEURONTIN) 600 MG tablet TAKE ONE TABLET (600 MG TOTAL) BY MOUTH 3 (THREE) TIMES A DAY.  2  . LORazepam (ATIVAN) 2 MG tablet 2 tabs po qhs 60 tablet 5  . promethazine (PHENERGAN) 12.5 MG tablet 1-2 tabs po q6h prn nausea 30 tablet 1  . simvastatin (ZOCOR) 20 MG tablet Take 20 mg by mouth daily.  6  . tiZANidine (ZANAFLEX) 4 MG tablet Take 4 mg by mouth once. Muscle relaxant    . gabapentin (NEURONTIN) 300 MG capsule TAKE 1 CAPSULE BY MOUTH TWICE A DAY (Patient not taking: Reported on 01/05/2018) 180 capsule 1   No facility-administered medications prior to visit.     Allergies  Allergen Reactions  . Codeine Anaphylaxis, Hives and Swelling  . Cyanocobalamin Shortness Of Breath, Palpitations and Other  (See Comments)    Weak,nausea, vision disturbance  . Fentanyl Itching and Rash  . Meperidine Rash  . Nsaids Rash  . Propofol Other (See Comments)    "out of it all day" after endoscopy  . Propoxyphene Rash  . Erythromycin Hives and Swelling    Pt states that she can take zpak  . Penicillins Hives  . Percocet [Oxycodone-Acetaminophen] Other (See Comments)    unknown  . Sulfa Antibiotics Hives and Swelling  . Tramadol Hives  . Vicodin [Hydrocodone-Acetaminophen] Other (See Comments)    unknown    ROS As per HPI  PE: Blood pressure 111/75, pulse 78, temperature 98.3 F (36.8 C), temperature source Oral, resp. rate 16, height 5\' 7"  (1.702 m), weight 192 lb 3 oz (87.2 kg), SpO2 96 %. VS: noted--normal. Gen: alert, NAD, NONTOXIC APPEARING. HEENT: eyes without injection, drainage, or swelling.  Ears: EACs clear, TMs with normal light reflex and landmarks.  Nose: Clear rhinorrhea, with some dried, crusty exudate adherent to mildly injected mucosa.  No purulent d/c.  L paranasal sinus and diffuse frontal sinus TTP.  No facial swelling.  Throat and mouth without focal lesion.  No pharyngial swelling, erythema, or exudate.   Neck: supple, no LAD.   LUNGS: CTA bilat, nonlabored resps.   CV: RRR, no m/r/g. EXT: no c/c/e SKIN: no rash Back: no CVA tenderness. She has mild diffuse L spine TTP LABS:   CC UA today:  All normal.    Chemistry      Component Value Date/Time   NA 139 11/15/2017 1435   NA 137 03/15/2014   K 4.5 11/15/2017 1435   CL 106 11/15/2017 1435   CO2 27 11/15/2017 1435   BUN 8 11/15/2017 1435   BUN 13 03/15/2014   CREATININE 0.62 11/15/2017 1435      Component Value Date/Time   CALCIUM 9.8 11/15/2017 1435   ALKPHOS 89 11/15/2017 1435   AST 23 11/15/2017 1435   ALT 25 11/15/2017 1435   BILITOT 0.2 11/15/2017 1435       IMPRESSION AND PLAN:  1) Acute cystitis possible, but UA does not support this dx. Will send urine for c/s and start cipro 500 mg  bid. I think her LB pain is musculoskeletal, NOT urinary in etiology.  2) Acute sinusitis: cipro x 10d (pt has lots of antibiotic intolerances/allergies). OTC symptomatic care discussed.  An After Visit Summary was printed and given to the patient.  FOLLOW UP: Return if symptoms worsen or fail to improve.  Signed:  Crissie Sickles, MD  03/17/2018     

## 2018-03-18 ENCOUNTER — Encounter: Payer: Self-pay | Admitting: Family Medicine

## 2018-03-18 ENCOUNTER — Telehealth: Payer: Self-pay | Admitting: Family Medicine

## 2018-03-18 DIAGNOSIS — G894 Chronic pain syndrome: Secondary | ICD-10-CM | POA: Diagnosis not present

## 2018-03-18 DIAGNOSIS — M47812 Spondylosis without myelopathy or radiculopathy, cervical region: Secondary | ICD-10-CM | POA: Diagnosis not present

## 2018-03-18 DIAGNOSIS — M7918 Myalgia, other site: Secondary | ICD-10-CM | POA: Diagnosis not present

## 2018-03-18 LAB — URINE CULTURE
MICRO NUMBER:: 90825035
Result:: NO GROWTH
SPECIMEN QUALITY:: ADEQUATE

## 2018-03-18 MED ORDER — CEFDINIR 300 MG PO CAPS: 300.0000 mg | ORAL_CAPSULE | Freq: Two times a day (BID) | ORAL | 0 refills | Status: DC

## 2018-03-18 NOTE — Telephone Encounter (Signed)
Tell pt I sent in rx for cefdinir. This is in the same category as keflex, but is not likely to cause abd pain for her like the keflex did. -thx

## 2018-03-18 NOTE — Telephone Encounter (Signed)
Please advise. Thanks.  

## 2018-03-18 NOTE — Telephone Encounter (Signed)
Copied from [place] [id number]. Topic: Quick Communication - See Telephone Encounter >> Mar 18, 2018 10:20 AM Nils Flack wrote: CRM for notification. See Telephone encounter for: 03/18/18. Pharm called -  Medication ciprofloxacin (CIPRO) 500 MG tablet has a level 1 interaction tizandine and level 2 with cymbalta.  Pt would like to know if she needs a different abx.  Pharm number for pharm is [phone] Pt also wants a call if you want her to stay on it.

## 2018-03-18 NOTE — Telephone Encounter (Signed)
Pt sent a mychart message about this. Message has already been sent to Dr. Anitra Lauth for review. Will save this message to chart.

## 2018-03-21 ENCOUNTER — Encounter: Payer: Self-pay | Admitting: *Deleted

## 2018-03-21 MED ORDER — AZITHROMYCIN 250 MG PO TABS: ORAL_TABLET | ORAL | 0 refills | Status: DC

## 2018-03-21 NOTE — Telephone Encounter (Signed)
See message from pt. Please advise. Thanks.

## 2018-03-21 NOTE — Telephone Encounter (Signed)
OK. She says she tolerates Zpack, so this is what I eRx'd.

## 2018-03-25 DIAGNOSIS — G894 Chronic pain syndrome: Secondary | ICD-10-CM | POA: Insufficient documentation

## 2018-04-05 DIAGNOSIS — K317 Polyp of stomach and duodenum: Secondary | ICD-10-CM | POA: Diagnosis not present

## 2018-04-05 DIAGNOSIS — K219 Gastro-esophageal reflux disease without esophagitis: Secondary | ICD-10-CM | POA: Diagnosis not present

## 2018-04-07 ENCOUNTER — Ambulatory Visit: Payer: 59 | Admitting: Family Medicine

## 2018-04-11 ENCOUNTER — Encounter: Payer: Self-pay | Admitting: Family Medicine

## 2018-04-13 ENCOUNTER — Encounter: Payer: Self-pay | Admitting: *Deleted

## 2018-04-14 ENCOUNTER — Ambulatory Visit: Payer: 59 | Admitting: Family Medicine

## 2018-04-14 NOTE — Progress Notes (Deleted)
OFFICE VISIT  04/14/2018   CC: No chief complaint on file.    HPI:    Patient is a 57 y.o. Caucasian female who presents for ***  Past Medical History:  Diagnosis Date  . Arthritis   . Blood in stool    EGD and colonoscopy w/out source of bleeding 12/2017-->+ hemorrhoids-->GI suspects hemorrhoid dz as cause of blood in stool.  . Chronic post-traumatic stress disorder (PTSD)    + persistent complicated bereavement disorder (Dr. Doroteo Glassman, PhD, psychology).  . Complex regional pain syndrome    onset with broken R wrist; pain in both arms  . Depression    anx and dep  . Frequent headaches    migraine synd  . GAD (generalized anxiety disorder)   . Gastric polyp 12/2017   Hyperplastic->Dr. outlaw considering removal in hosp after severe GERD fully addressed.  Marland Kitchen GERD (gastroesophageal reflux disease)    w/dysphagia and nausea.  Dr. Paulita Fujita: failed multiple PPIs, EGD normal except small hiatal hernia and 12 mm gastric polyp--48H esoph manometry planned as of 02/23/18  . History of cellulitis    with abscess or oral soft tissues  . Hyperlipidemia   . IBS (irritable bowel syndrome)    Diarrhea  . Insomnia   . Insulin resistance   . Iron deficiency anemia 12/2017   11/2017.  Pt to get EGD/Colonoscopy by Dr. Paulita Fujita 01/2018.  . Nephrolithiasis    never had to have one extracted or lithotripsy  . Palpitations    PVCs: no medical therpay recommended by her cardiologist in [place], [place].  Marland Kitchen Recurrent UTI    approx 3 per year.  . Vitamin B12 deficiency    IF ab neg.  Intol B12 inj.  Started B12 oral 01/2018    Past Surgical History:  Procedure Laterality Date  . ABDOMINAL HYSTERECTOMY  age 45   endometriosis; also history of "severely abnormal pap"  . CESAREAN SECTION     X 2  . CHOLECYSTECTOMY  age 70  . COLONOSCOPY  2012   Normal  . COLONOSCOPY  01/18/2018   Normal: repeat 10 years  . ESOPHAGOGASTRODUODENOSCOPY  01/2018   Hyperplastic gastric polyp-->GI (Outlaw)  considering polyp removal in hosp.  . LE venous doppler U/s  04/2011   No DVT  . NASAL SINUS SURGERY  age 27  . TONSILLECTOMY  age 28  . UMBILICAL HERNIA REPAIR  age 39    Outpatient Medications Prior to Visit  Medication Sig Dispense Refill  . azithromycin (ZITHROMAX) 250 MG tablet 2 tabs po qd x 1d, then 1 tab po qd x 4d 6 tablet 0  . dexlansoprazole (DEXILANT) 60 MG capsule Take 1 capsule (60 mg total) by mouth daily. 90 capsule 1  . DULoxetine (CYMBALTA) 60 MG capsule Take 60 mg by mouth daily.    Marland Kitchen gabapentin (NEURONTIN) 600 MG tablet TAKE ONE TABLET (600 MG TOTAL) BY MOUTH 3 (THREE) TIMES A DAY.  2  . LORazepam (ATIVAN) 2 MG tablet 2 tabs po qhs 60 tablet 5  . promethazine (PHENERGAN) 12.5 MG tablet 1-2 tabs po q6h prn nausea 30 tablet 1  . simvastatin (ZOCOR) 20 MG tablet Take 20 mg by mouth daily.  6  . tiZANidine (ZANAFLEX) 4 MG tablet Take 4 mg by mouth once. Muscle relaxant     No facility-administered medications prior to visit.     Allergies  Allergen Reactions  . Codeine Anaphylaxis, Hives and Swelling  . Cyanocobalamin Shortness Of Breath, Palpitations and Other (See Comments)  Weak,nausea, vision disturbance  . Fentanyl Itching and Rash  . Meperidine Rash  . Nsaids Rash  . Propofol Other (See Comments)    "out of it all day" after endoscopy  . Propoxyphene Rash  . Cefdinir Itching    Itching + "hot" neck rash  . Erythromycin Hives and Swelling    Pt states that she can take zpak  . Penicillins Hives  . Percocet [Oxycodone-Acetaminophen] Other (See Comments)    unknown  . Sulfa Antibiotics Hives and Swelling  . Tramadol Hives  . Vicodin [Hydrocodone-Acetaminophen] Other (See Comments)    unknown  . Cephalexin Other (See Comments)    Abdominal pain    ROS As per HPI  PE: There were no vitals taken for this visit. ***  LABS:    Chemistry      Component Value Date/Time   NA 139 11/15/2017 1435   NA 137 03/15/2014   K 4.5 11/15/2017 1435   CL  106 11/15/2017 1435   CO2 27 11/15/2017 1435   BUN 8 11/15/2017 1435   BUN 13 03/15/2014   CREATININE 0.62 11/15/2017 1435      Component Value Date/Time   CALCIUM 9.8 11/15/2017 1435   ALKPHOS 89 11/15/2017 1435   AST 23 11/15/2017 1435   ALT 25 11/15/2017 1435   BILITOT 0.2 11/15/2017 1435     Lab Results  Component Value Date   WBC 4.6 01/05/2018   HGB 11.1 (L) 01/05/2018   HCT 34.7 (L) 01/05/2018   MCV 77.9 (L) 01/05/2018   PLT 408.0 (H) 01/05/2018   Lab Results  Component Value Date   IRON 21 (L) 11/17/2017   FERRITIN 4.8 (L) 11/17/2017   Lab Results  Component Value Date   CHOL 196 01/05/2018   HDL 51.50 01/05/2018   LDLCALC 122 (H) 01/05/2018   TRIG 113.0 01/05/2018   CHOLHDL 4 01/05/2018   Lab Results  Component Value Date   TSH 1.37 11/15/2017    IMPRESSION AND PLAN:  No problem-specific Assessment & Plan notes found for this encounter.   An After Visit Summary was printed and given to the patient.  FOLLOW UP: No follow-ups on file.   Signed:  Crissie Sickles, MD           04/14/2018

## 2018-04-18 ENCOUNTER — Encounter: Payer: Self-pay | Admitting: Family Medicine

## 2018-04-26 ENCOUNTER — Encounter: Payer: Self-pay | Admitting: Family Medicine

## 2018-04-26 ENCOUNTER — Ambulatory Visit (INDEPENDENT_AMBULATORY_CARE_PROVIDER_SITE_OTHER): Payer: 59 | Admitting: Family Medicine

## 2018-04-26 ENCOUNTER — Encounter: Payer: Self-pay | Admitting: *Deleted

## 2018-04-26 VITALS — BP 106/73 | HR 79 | Temp 98.3°F | Resp 16 | Ht 67.0 in | Wt 187.2 lb

## 2018-04-26 DIAGNOSIS — E7800 Pure hypercholesterolemia, unspecified: Secondary | ICD-10-CM

## 2018-04-26 DIAGNOSIS — E538 Deficiency of other specified B group vitamins: Secondary | ICD-10-CM

## 2018-04-26 DIAGNOSIS — Z23 Encounter for immunization: Secondary | ICD-10-CM

## 2018-04-26 DIAGNOSIS — J329 Chronic sinusitis, unspecified: Secondary | ICD-10-CM

## 2018-04-26 DIAGNOSIS — D509 Iron deficiency anemia, unspecified: Secondary | ICD-10-CM

## 2018-04-26 DIAGNOSIS — Z9889 Other specified postprocedural states: Secondary | ICD-10-CM

## 2018-04-26 DIAGNOSIS — K219 Gastro-esophageal reflux disease without esophagitis: Secondary | ICD-10-CM

## 2018-04-26 LAB — BASIC METABOLIC PANEL
BUN: 13 mg/dL (ref 6–23)
CALCIUM: 10.2 mg/dL (ref 8.4–10.5)
CO2: 29 mEq/L (ref 19–32)
CREATININE: 0.79 mg/dL (ref 0.40–1.20)
Chloride: 104 mEq/L (ref 96–112)
GFR: 79.67 mL/min (ref >60.00)
Glucose, Bld: 90 mg/dL (ref 70–99)
Potassium: 4.7 mEq/L (ref 3.5–5.1)
SODIUM: 139 meq/L (ref 135–145)

## 2018-04-26 LAB — CBC
HEMATOCRIT: 39.1 % (ref 36.0–46.0)
Hemoglobin: 12.4 g/dL (ref 12.0–15.0)
MCHC: 31.7 g/dL (ref 30.0–36.0)
MCV: 78.1 fl (ref 78.0–100.0)
Platelets: 502 10*3/uL — ABNORMAL HIGH (ref 150.0–400.0)
RBC: 5.01 Mil/uL (ref 3.87–5.11)
RDW: 18.5 % — AB (ref 11.5–15.5)
WBC: 4.5 10*3/uL (ref 4.0–10.5)

## 2018-04-26 LAB — FERRITIN: Ferritin: 6.2 ng/mL — ABNORMAL LOW (ref 10.0–291.0)

## 2018-04-26 LAB — IRON: IRON: 64 ug/dL (ref 42–145)

## 2018-04-26 LAB — VITAMIN B12: VITAMIN B 12: 297 pg/mL (ref 211–911)

## 2018-04-26 MED ORDER — AZITHROMYCIN 250 MG PO TABS: ORAL_TABLET | ORAL | 0 refills | Status: DC

## 2018-04-26 MED ORDER — FERROUS SULFATE ER 142 (45 FE) MG PO TBCR: EXTENDED_RELEASE_TABLET | ORAL | 1 refills | Status: DC

## 2018-04-26 NOTE — Progress Notes (Signed)
OFFICE VISIT  04/26/2018   CC:  Chief Complaint  Patient presents with  . Follow-up    RCI, pt is fasting.   HPI:    Patient is a 57 y.o. Caucasian female who presents for f/u IDA, vit B12 def, and recurrent sinusitis.  Says the z-pack I rx'd her last visit 6 wks ago helped clear up her sinusitis, but then about 3 wks ago she got return of nasal congestion/mucous, sinus pressure, PND, scratchy/hoarse voice.  No ST, cough, or fevers. Has hx of chronic/recurrent sinusitis, hx of sinus surgery, has lots of antibiotic intolerances/allergies. At this point the only thing she is comfortable taking is Z pack.  [facility]: CT scan of abd/pelv, gastric polyp again was noted, but no other problem. They want to watch this lesion, no surgery rec'd at this time. Her PPI was increased to bid (dexilant 60).  She has f/u with Dr. Malissa Hippo 04/30/18.  May have to get pH probe and esoph manometry.  Still having some nausea.  Asks for phenergan rx b/c she says this helps very well.  Denies side effect from this med.  Vit B12 def: her level came up with some weekly B12 injections.  She now has been taking 1200 mcg otc vit b12 tab qd x 6 wks or so.  Lorazepam 2 of the 2 mg tabs qhs has helped her insomnia, has been on this long term. Discussed CSC today.  Past Medical History:  Diagnosis Date  . Arthritis   . Blood in stool    EGD and colonoscopy w/out source of bleeding 12/2017-->+ hemorrhoids-->GI suspects hemorrhoid dz as cause of blood in stool.  . Chronic post-traumatic stress disorder (PTSD)    + persistent complicated bereavement disorder (Dr. Doroteo Glassman, PhD, psychology).  . Complex regional pain syndrome    onset with broken R wrist; pain in both arms  . Depression    anx and dep  . Frequent headaches    migraine synd  . GAD (generalized anxiety disorder)   . Gastric polyp 12/2017   Hyperplastic->Dr. outlaw considering removal in hosp after severe GERD fully addressed.  Marland Kitchen GERD  (gastroesophageal reflux disease)    w/dysphagia and nausea.  Dr. Paulita Fujita: failed multiple PPIs, EGD normal except small hiatal hernia and 12 mm gastric polyp--48H esoph manometry planned as of 02/23/18.  [facility]-->FOURTH opinion 04/05/18: bid dexilant started.  If not imprvmt in 45mo, plan for esoph manom and pH probe.  Marland Kitchen History of cellulitis    with abscess or oral soft tissues  . Hyperlipidemia   . IBS (irritable bowel syndrome)    Diarrhea  . Insomnia   . Insulin resistance   . Iron deficiency anemia 12/2017   11/2017.  Pt to get EGD/Colonoscopy by Dr. Paulita Fujita 01/2018.  . Nephrolithiasis    never had to have one extracted or lithotripsy  . Palpitations    PVCs: no medical therpay recommended by her cardiologist in [place], [place].  Marland Kitchen Recurrent UTI    approx 3 per year.  . Vitamin B12 deficiency    IF ab neg.  Intol B12 inj.  Started B12 oral 01/2018    Past Surgical History:  Procedure Laterality Date  . ABDOMINAL HYSTERECTOMY  age 15   endometriosis; also history of "severely abnormal pap"  . CESAREAN SECTION     X 2  . CHOLECYSTECTOMY  age 41  . COLONOSCOPY  2012   Normal  . COLONOSCOPY  01/18/2018   Normal: repeat 10 years  .  ESOPHAGOGASTRODUODENOSCOPY  01/2018   Hyperplastic gastric polyp-->GI (Outlaw) considering polyp removal in hosp.  . LE venous doppler U/s  04/2011   No DVT  . NASAL SINUS SURGERY  age 34  . TONSILLECTOMY  age 61  . UMBILICAL HERNIA REPAIR  age 22    Outpatient Medications Prior to Visit  Medication Sig Dispense Refill  . dexlansoprazole (DEXILANT) 60 MG capsule Take 1 capsule (60 mg total) by mouth daily. 90 capsule 1  . DULoxetine (CYMBALTA) 60 MG capsule Take 60 mg by mouth daily.    Marland Kitchen gabapentin (NEURONTIN) 600 MG tablet TAKE ONE TABLET (600 MG TOTAL) BY MOUTH 3 (THREE) TIMES A DAY.  2  . LORazepam (ATIVAN) 2 MG tablet 2 tabs po qhs 60 tablet 5  . promethazine (PHENERGAN) 12.5 MG tablet 1-2 tabs po q6h prn nausea 30 tablet 1  . simvastatin  (ZOCOR) 20 MG tablet Take 20 mg by mouth daily.  6  . tiZANidine (ZANAFLEX) 4 MG tablet Take 4 mg by mouth once. Muscle relaxant    . azithromycin (ZITHROMAX) 250 MG tablet 2 tabs po qd x 1d, then 1 tab po qd x 4d (Patient not taking: Reported on 04/26/2018) 6 tablet 0   No facility-administered medications prior to visit.     Allergies  Allergen Reactions  . Codeine Anaphylaxis, Hives and Swelling  . Cyanocobalamin Shortness Of Breath, Palpitations and Other (See Comments)    Weak,nausea, vision disturbance  . Fentanyl Itching and Rash  . Meperidine Rash  . Nsaids Rash  . Propofol Other (See Comments)    "out of it all day" after endoscopy  . Propoxyphene Rash  . Cefdinir Itching    Itching + "hot" neck rash  . Erythromycin Hives and Swelling    Pt states that she can take zpak  . Penicillins Hives  . Percocet [Oxycodone-Acetaminophen] Other (See Comments)    unknown  . Sulfa Antibiotics Hives and Swelling  . Tramadol Hives  . Vicodin [Hydrocodone-Acetaminophen] Other (See Comments)    unknown  . Cephalexin Other (See Comments)    Abdominal pain    ROS As per HPI  PE: Blood pressure 106/73, pulse 79, temperature 98.3 F (36.8 C), temperature source Oral, resp. rate 16, height 5\' 7"  (1.702 m), weight 187 lb 4 oz (84.9 kg), SpO2 97 %. Body mass index is 29.33 kg/m.  VS: noted--normal. Gen: alert, NAD, WELL-APPEARING. HEENT: eyes without injection, drainage, or swelling.  Ears: EACs clear, TMs with normal light reflex and landmarks.  Nose: Clear rhinorrhea, with some dried, crusty exudate adherent to mildly injected mucosa.  No purulent d/c.  Diffuse bilat paranasal sinus TTP.  No facial swelling.  Throat and mouth without focal lesion.  No pharyngial swelling, erythema, or exudate.   Neck: supple, no LAD.   LUNGS: CTA bilat, nonlabored resps.   CV: RRR, no m/r/g. EXT: no c/c/e SKIN: no rash  LABS:  Lab Results  Component Value Date   TSH 1.37 11/15/2017   Lab  Results  Component Value Date   WBC 4.6 01/05/2018   HGB 11.1 (L) 01/05/2018   HCT 34.7 (L) 01/05/2018   MCV 77.9 (L) 01/05/2018   PLT 408.0 (H) 01/05/2018   Lab Results  Component Value Date   IRON 21 (L) 11/17/2017   FERRITIN 4.8 (L) 11/17/2017    Lab Results  Component Value Date   CREATININE 0.62 11/15/2017   BUN 8 11/15/2017   NA 139 11/15/2017   K 4.5 11/15/2017  CL 106 11/15/2017   CO2 27 11/15/2017   Lab Results  Component Value Date   ALT 25 11/15/2017   AST 23 11/15/2017   ALKPHOS 89 11/15/2017   BILITOT 0.2 11/15/2017   Lab Results  Component Value Date   CHOL 196 01/05/2018   Lab Results  Component Value Date   HDL 51.50 01/05/2018   Lab Results  Component Value Date   LDLCALC 122 (H) 01/05/2018   Lab Results  Component Value Date   TRIG 113.0 01/05/2018   Lab Results  Component Value Date   CHOLHDL 4 01/05/2018   Lab Results  Component Value Date   HGBA1C 5.9 01/20/2017    IMPRESSION AND PLAN:  1) IDA; GI blood loss suspected but site not confidently identified. For some reason pt did not start iron supplement as instructed.  Has hx of constipation from iron supplements in the past, but recalls tolerating slow Fe when she was pregnant, so I've started her on 1 Slow Fe tab qd. She is getting followed by local GI MD AND [facility] for GERD and gastric polyp. Checking CBC, iron panel today.  2) Vit B12 def: levels rose into low normal range with several weeks of vit B12 injections. We'll see how her level is after being on oral B12 for the last 6 wks or so.  3) Recurrent sinusitis, hx of sinus surgery: she describes purulent mucous again, etc. She is only comfortable taking Z pack, although she agreed to check with her last pharmacist about whether or not she has had any problems taking clindamycin in the past---would like to be able to get her on clindamycin. ENT referral ordered today: Dr. Erik Obey.  4) Insomnia, strongly related to  chronic anxiety: stable/controlled on 4 mg ativan qhs. Controlled substance contract reviewed with patient today.  Patient signed this and it will be placed in the chart.    5) GERD: continue dexilant 60mg  bid, keep f/u with [facility], Dr. Malissa Hippo. 24h pH probe and esoph manometry may be in her future.  An After Visit Summary was printed and given to the patient.  FOLLOW UP: Return in about 3 months (around 07/27/2018) for routine chronic illness f/u (30 min).  Signed:  Crissie Sickles, MD           04/26/2018

## 2018-04-27 ENCOUNTER — Encounter: Payer: Self-pay | Admitting: Family Medicine

## 2018-04-27 LAB — IRON AND TIBC
IRON SATURATION: 12 % — AB (ref 15–55)
Iron: 56 ug/dL (ref 27–159)
TIBC: 454 ug/dL — AB (ref 250–450)
UIBC: 398 ug/dL (ref 131–425)

## 2018-04-27 MED ORDER — PROMETHAZINE HCL 12.5 MG PO TABS: ORAL_TABLET | ORAL | 3 refills | Status: DC

## 2018-04-27 NOTE — Telephone Encounter (Signed)
Please advise. Thanks.  

## 2018-05-12 DIAGNOSIS — G894 Chronic pain syndrome: Secondary | ICD-10-CM | POA: Diagnosis not present

## 2018-05-12 DIAGNOSIS — M47812 Spondylosis without myelopathy or radiculopathy, cervical region: Secondary | ICD-10-CM | POA: Diagnosis not present

## 2018-05-12 DIAGNOSIS — K219 Gastro-esophageal reflux disease without esophagitis: Secondary | ICD-10-CM | POA: Diagnosis not present

## 2018-05-25 DIAGNOSIS — M47812 Spondylosis without myelopathy or radiculopathy, cervical region: Secondary | ICD-10-CM | POA: Diagnosis not present

## 2018-05-25 DIAGNOSIS — G894 Chronic pain syndrome: Secondary | ICD-10-CM | POA: Diagnosis not present

## 2018-05-25 DIAGNOSIS — M549 Dorsalgia, unspecified: Secondary | ICD-10-CM | POA: Diagnosis not present

## 2018-05-30 ENCOUNTER — Other Ambulatory Visit: Payer: Self-pay | Admitting: Family Medicine

## 2018-05-31 DIAGNOSIS — G9050 Complex regional pain syndrome I, unspecified: Secondary | ICD-10-CM | POA: Diagnosis not present

## 2018-05-31 DIAGNOSIS — K219 Gastro-esophageal reflux disease without esophagitis: Secondary | ICD-10-CM | POA: Diagnosis not present

## 2018-06-05 ENCOUNTER — Encounter: Payer: Self-pay | Admitting: Family Medicine

## 2018-06-07 DIAGNOSIS — M5030 Other cervical disc degeneration, unspecified cervical region: Secondary | ICD-10-CM

## 2018-06-07 HISTORY — DX: Other cervical disc degeneration, unspecified cervical region: M50.30

## 2018-06-14 ENCOUNTER — Encounter: Payer: Self-pay | Admitting: Family Medicine

## 2018-06-14 ENCOUNTER — Ambulatory Visit (INDEPENDENT_AMBULATORY_CARE_PROVIDER_SITE_OTHER): Payer: 59 | Admitting: Family Medicine

## 2018-06-14 VITALS — BP 118/81 | HR 81 | Temp 97.9°F | Resp 16 | Ht 67.0 in | Wt 186.0 lb

## 2018-06-14 DIAGNOSIS — R51 Headache: Secondary | ICD-10-CM

## 2018-06-14 DIAGNOSIS — M542 Cervicalgia: Secondary | ICD-10-CM | POA: Diagnosis not present

## 2018-06-14 DIAGNOSIS — R29898 Other symptoms and signs involving the musculoskeletal system: Secondary | ICD-10-CM

## 2018-06-14 DIAGNOSIS — G4486 Cervicogenic headache: Secondary | ICD-10-CM

## 2018-06-14 DIAGNOSIS — M5412 Radiculopathy, cervical region: Secondary | ICD-10-CM | POA: Diagnosis not present

## 2018-06-14 DIAGNOSIS — G90513 Complex regional pain syndrome I of upper limb, bilateral: Secondary | ICD-10-CM

## 2018-06-14 DIAGNOSIS — G894 Chronic pain syndrome: Secondary | ICD-10-CM

## 2018-06-14 NOTE — Progress Notes (Signed)
OFFICE VISIT  06/14/2018   CC:  Chief Complaint  Patient presents with  . Neck Pain  . Headache   HPI:    Patient is a 57 y.o. Caucasian female who presents for a long story about recent pain. Radiofrequency ablation done 1 mo ago C3-C6 on L at [facility]. She said she had a pain rxn and swelling at the site of the injection. They rx'd ibup but her stomach could not tolerate this. Medrol dose pack --no help. She says they increased her gabapentin--No help for her "soft tissue injury". They told her she has DDD and that this should be addressed as a possible cause of her pain. She is quite disturbed to be told that she has osteoarthritis, surprisingly. She says she is not going back to current pain clinic. A neurosurgeon at [facility] who her GI MD referred her to said he required a PCP- obtained MRI of neck before he would see her.   Currently she is hurting in entire neck up into the back of her head, also radiates into top of trap and scap and anterior shoulder region.   She has not done any PT for her neck.  Using ice a lot. Currently on no pain meds---can't tolerate NSAIDS or opioids. No hx of traumatic injury to neck.  Tingling and numbness down entire L arm, only at night, only started after the most recent procedure.  She feels like the L arm is weak, particularly proximally.  B/B function intact.  No saddle anesthesia.  She has HA's that extend from back of neck up to occipital region and top of head.  Past Medical History:  Diagnosis Date  . Arthritis   . Blood in stool    EGD and colonoscopy w/out source of bleeding 12/2017-->+ hemorrhoids-->GI suspects hemorrhoid dz as cause of blood in stool.  . Chronic post-traumatic stress disorder (PTSD)    + persistent complicated bereavement disorder (Dr. Doroteo Glassman, PhD, psychology).  . Complex regional pain syndrome    onset with broken R wrist; pain in both arms  . Depression    anx and dep  . Frequent headaches    migraine synd  . GAD (generalized anxiety disorder)   . Gastric polyp 12/2017   Hyperplastic->Dr. outlaw considering removal in hosp after severe GERD fully addressed.  Marland Kitchen GERD (gastroesophageal reflux disease)    w/dysphagia and nausea.  Dr. Paulita Fujita: failed multiple PPIs, EGD normal except small hiatal hernia and 12 mm gastric polyp--48H esoph manometry planned as of 02/23/18.  [facility]-->FOURTH opinion 04/05/18: bid dexilant started.  Pt imprvd at f/u 05/31/18  . History of cellulitis    with abscess or oral soft tissues  . Hyperlipidemia   . IBS (irritable bowel syndrome)    Diarrhea  . Insomnia   . Insulin resistance   . Iron deficiency anemia 12/2017   11/2017.  Pt to get EGD/Colonoscopy by Dr. Paulita Fujita 01/2018.  . Nephrolithiasis    never had to have one extracted or lithotripsy  . Palpitations    PVCs: no medical therpay recommended by her cardiologist in [place], [place].  Marland Kitchen Recurrent UTI    approx 3 per year.  . Vitamin B12 deficiency    IF ab neg.  Intol B12 inj.  Started B12 oral 01/2018    Past Surgical History:  Procedure Laterality Date  . ABDOMINAL HYSTERECTOMY  age 65   endometriosis; also history of "severely abnormal pap"  . CESAREAN SECTION     X 2  .  CHOLECYSTECTOMY  age 6  . COLONOSCOPY  2012   Normal  . COLONOSCOPY  01/18/2018   Normal: repeat 10 years  . ESOPHAGOGASTRODUODENOSCOPY  01/2018   Hyperplastic gastric polyp-->GI ([facility]) considering polyp removal in hosp.  . LE venous doppler U/s  04/2011   No DVT  . NASAL SINUS SURGERY  age 40  . TONSILLECTOMY  age 32  . UMBILICAL HERNIA REPAIR  age 64    Outpatient Medications Prior to Visit  Medication Sig Dispense Refill  . dexlansoprazole (DEXILANT) 60 MG capsule Take 1 capsule (60 mg total) by mouth daily. 90 capsule 1  . DULoxetine (CYMBALTA) 60 MG capsule Take 60 mg by mouth daily.    Marland Kitchen gabapentin (NEURONTIN) 600 MG tablet TAKE ONE TABLET (600 MG TOTAL) BY MOUTH 3 (THREE) TIMES A DAY.  2  . LORazepam  (ATIVAN) 2 MG tablet 2 tabs po qhs 60 tablet 5  . Multiple Vitamin (MULTIVITAMIN) tablet Take 1 tablet by mouth daily.    . pediatric multivitamin-iron (POLY-VI-SOL WITH IRON) 15 MG chewable tablet Chew 1 tablet by mouth daily.    . promethazine (PHENERGAN) 12.5 MG tablet 1-2 tabs po q6h prn nausea 30 tablet 3  . simvastatin (ZOCOR) 20 MG tablet TAKE 1 TABLET BY MOUTH EVERY DAY 90 tablet 1  . tiZANidine (ZANAFLEX) 4 MG tablet Take 4 mg by mouth once. Muscle relaxant    . azithromycin (ZITHROMAX) 250 MG tablet 2 tabs po qd x 1d, then 1 tab po qd x 4d (Patient not taking: Reported on 06/14/2018) 6 tablet 0  . Ferrous Sulfate 142 (45 Fe) MG TBCR 1 tab po qd with food (preferably orange juice) (Patient not taking: Reported on 06/14/2018) 90 tablet 1  . simvastatin (ZOCOR) 20 MG tablet Take 20 mg by mouth daily.  6   No facility-administered medications prior to visit.     Allergies  Allergen Reactions  . Codeine Anaphylaxis, Hives and Swelling  . Cyanocobalamin Shortness Of Breath, Palpitations and Other (See Comments)    Weak,nausea, vision disturbance  . Fentanyl Itching and Rash  . Meperidine Rash  . Nsaids Rash  . Propofol Other (See Comments)    "out of it all day" after endoscopy  . Propoxyphene Rash  . Cefdinir Itching    Itching + "hot" neck rash  . Erythromycin Hives and Swelling    Pt states that she can take zpak  . Penicillins Hives  . Percocet [Oxycodone-Acetaminophen] Other (See Comments)    unknown  . Sulfa Antibiotics Hives and Swelling  . Tramadol Hives  . Vicodin [Hydrocodone-Acetaminophen] Other (See Comments)    unknown  . Cephalexin Other (See Comments)    Abdominal pain    ROS As per HPI  PE: Blood pressure 118/81, pulse 81, temperature 97.9 F (36.6 C), temperature source Oral, resp. rate 16, height 5\' 7"  (1.702 m), weight 186 lb (84.4 kg), SpO2 98 %. Gen: Alert, well appearing.  Patient is oriented to person, place, time, and situation. AFFECT:  pleasant, lucid thought and speech. WNU:UVOZ: no injection, icteris, swelling, or exudate.  EOMI, PERRLA. Mouth: lips without lesion/swelling.  Oral mucosa pink and moist. Oropharynx without erythema, exudate, or swelling.  CV: RRR, no m/r/g.   LUNGS: CTA bilat, nonlabored resps, good aeration in all lung fields. Neck: ROM intact but flexion causes pain in back of neck.  Extension is fine.  Further C spine ROM stiff but intact. She is TTP in posterior soft tissues of neck and her upper back  diffusely on left side.   No pain with ER/IR/ABduct/adduction of shoulders. Spurling's neg bilat. UE strength 4/5 proximally on L.  Otherwise strength 5/5 prox/dist on right and 5/5 distally on left. DTRs UE's all trace bilat. LE's with 3+ DTRs in patellar and achilles regions, with 1 beat of clonus bilat.  LABS:  None today.    Chemistry      Component Value Date/Time   NA 139 04/26/2018 1120   NA 137 03/15/2014   K 4.7 04/26/2018 1120   CL 104 04/26/2018 1120   CO2 29 04/26/2018 1120   BUN 13 04/26/2018 1120   BUN 13 03/15/2014   CREATININE 0.79 04/26/2018 1120      Component Value Date/Time   CALCIUM 10.2 04/26/2018 1120   ALKPHOS 89 11/15/2017 1435   AST 23 11/15/2017 1435   ALT 25 11/15/2017 1435   BILITOT 0.2 11/15/2017 1435       IMPRESSION AND PLAN:  Acute neck pain: some sx's c/w left sided cervical and trapezius soft tissue injury, other sx's consistent with cervical DDD with radiculopathy, including left arm proximal mm weakness.   Plan: MRI C spine w/out contrast.   Of note, she does not tolerate NSAIDs or opioids--GI sx's. She is not going to return to her current pain mgmt clinic and will likely need referral to new pain md in future. Will proceed with the originally planned neurosurgeon visit at [facility] if MRI has abnormalities that warrant specialist attention.  Spent 45 min with pt today, with >50% of this time spent in counseling and care coordination regarding the  above problems.  An After Visit Summary was printed and given to the patient.  FOLLOW UP: Return in about 6 weeks (around 07/26/2018) for f/u neck pain (30 min).  Signed:  Crissie Sickles, MD           06/14/2018

## 2018-06-16 ENCOUNTER — Ambulatory Visit: Payer: 59 | Admitting: Family Medicine

## 2018-06-16 ENCOUNTER — Ambulatory Visit
Admission: RE | Admit: 2018-06-16 | Discharge: 2018-06-16 | Disposition: A | Discharge status: Home / Self Care | Payer: 59 | Source: Ambulatory Visit | Attending: Family Medicine | Admitting: Family Medicine

## 2018-06-16 DIAGNOSIS — M5412 Radiculopathy, cervical region: Secondary | ICD-10-CM

## 2018-06-16 DIAGNOSIS — R29898 Other symptoms and signs involving the musculoskeletal system: Secondary | ICD-10-CM

## 2018-06-16 DIAGNOSIS — R200 Anesthesia of skin: Secondary | ICD-10-CM | POA: Diagnosis not present

## 2018-06-19 ENCOUNTER — Encounter: Payer: Self-pay | Admitting: Family Medicine

## 2018-06-21 ENCOUNTER — Encounter: Payer: Self-pay | Admitting: Family Medicine

## 2018-06-21 ENCOUNTER — Telehealth: Payer: Self-pay | Admitting: Family Medicine

## 2018-06-21 NOTE — Telephone Encounter (Signed)
SW pt, she did not have questions about her MRI. She just wanted to make sure we got her message.   She wants the MRI and her allergy list faxed to [facility] as requested below. She stated they need that before they will set up an apt.   She stated that her GI doctor put in a referral already.  Please advise. Thanks.

## 2018-06-21 NOTE — Telephone Encounter (Signed)
Tracy Olsen at [facility] called and said that she did receive the MRI but she said she needs to know was this done at [facility] or at an out patient center. She needs something with the actual logo on it. She can be reached at [phone]

## 2018-06-21 NOTE — Telephone Encounter (Signed)
Left detailed message for Tracy Olsen advising her that pt had MRI done at [facility] and that we do not have a report with a logo, she will need to contact [facility].

## 2018-06-21 NOTE — Telephone Encounter (Signed)
OK to send what pt requests to the [facility] neurosurgeon's office.

## 2018-06-21 NOTE — Telephone Encounter (Signed)
Copy of MRI and Allergy/Med List faxed to number below.   Pt advised and voiced understanding.

## 2018-06-21 NOTE — Telephone Encounter (Signed)
Copied from [place] [phone] [id number]. Topic: General - Other >> Jun 21, 2018 10:40 AM Cecelia Byars, NT wrote: Reason for CRM: Patient would like to discuss the MRI report with Nira Conn she says she it is very important she has a return call ,please call her at [phone]. She is also requesting the report is faxed to [facility] at  [phone] Dr Lemar Lofty office they are also asking for a allergy list as soon as possible .

## 2018-07-07 ENCOUNTER — Ambulatory Visit
Admission: RE | Admit: 2018-07-07 | Discharge: 2018-07-07 | Disposition: A | Discharge status: Home / Self Care | Payer: 59 | Source: Ambulatory Visit | Attending: Family Medicine | Admitting: Family Medicine

## 2018-07-07 ENCOUNTER — Ambulatory Visit (INDEPENDENT_AMBULATORY_CARE_PROVIDER_SITE_OTHER): Payer: 59 | Admitting: Family Medicine

## 2018-07-07 ENCOUNTER — Encounter: Payer: Self-pay | Admitting: Family Medicine

## 2018-07-07 VITALS — BP 122/84 | HR 89 | Temp 98.5°F | Resp 16 | Ht 67.0 in | Wt 193.0 lb

## 2018-07-07 DIAGNOSIS — J069 Acute upper respiratory infection, unspecified: Secondary | ICD-10-CM

## 2018-07-07 DIAGNOSIS — J019 Acute sinusitis, unspecified: Secondary | ICD-10-CM | POA: Diagnosis not present

## 2018-07-07 NOTE — Progress Notes (Signed)
OFFICE VISIT  07/07/2018   CC:  Chief Complaint  Patient presents with  . Sinusitis   HPI:    Patient is a 57 y.o. Caucasian female who presents for respiratory symptoms. Onset 4-5 d/a pain and pressure behind her eyes, nose bleeding some, eyes felt puffy.  Nasal congestion, no cough. No ST.  No fevers.   Took sudafed a couple of times.  Tylenol for HA.  Ears feel very stopped up.   Past Medical History:  Diagnosis Date  . Arthritis   . Blood in stool    EGD and colonoscopy w/out source of bleeding 12/2017-->+ hemorrhoids-->GI suspects hemorrhoid dz as cause of blood in stool.  . Chronic post-traumatic stress disorder (PTSD)    + persistent complicated bereavement disorder (Dr. Doroteo Glassman, PhD, psychology).  . Complex regional pain syndrome    onset with broken R wrist; pain in both arms  . DDD (degenerative disc disease), cervical 06/2018   MRI: moderate to severe C6-7 neural foraminal stenosis, L>R--->to neurosurgeon.  . Depression    anx and dep  . Frequent headaches    migraine synd  . GAD (generalized anxiety disorder)   . Gastric polyp 12/2017   Hyperplastic->Dr. outlaw considering removal in hosp after severe GERD fully addressed.  Marland Kitchen GERD (gastroesophageal reflux disease)    w/dysphagia and nausea.  Dr. Paulita Fujita: failed multiple PPIs, EGD normal except small hiatal hernia and 12 mm gastric polyp--48H esoph manometry planned as of 02/23/18.  [facility]-->FOURTH opinion 04/05/18: bid dexilant started.  Pt imprvd at f/u 05/31/18  . History of cellulitis    with abscess or oral soft tissues  . Hyperlipidemia   . IBS (irritable bowel syndrome)    Diarrhea  . Insomnia   . Insulin resistance   . Iron deficiency anemia 12/2017   11/2017.  Pt to get EGD/Colonoscopy by Dr. Paulita Fujita 01/2018.  . Nephrolithiasis    never had to have one extracted or lithotripsy  . Palpitations    PVCs: no medical therpay recommended by her cardiologist in [place], [place].  Marland Kitchen Recurrent UTI    approx  3 per year.  . Vitamin B12 deficiency    IF ab neg.  Intol B12 inj.  Started B12 oral 01/2018    Past Surgical History:  Procedure Laterality Date  . ABDOMINAL HYSTERECTOMY  age 84   endometriosis; also history of "severely abnormal pap"  . CESAREAN SECTION     X 2  . CHOLECYSTECTOMY  age 55  . COLONOSCOPY  2012   Normal  . COLONOSCOPY  01/18/2018   Normal: repeat 10 years  . ESOPHAGOGASTRODUODENOSCOPY  01/2018   Hyperplastic gastric polyp-->GI ([facility]) considering polyp removal in hosp.  . LE venous doppler U/s  04/2011   No DVT  . NASAL SINUS SURGERY  age 23  . TONSILLECTOMY  age 73  . UMBILICAL HERNIA REPAIR  age 79    Outpatient Medications Prior to Visit  Medication Sig Dispense Refill  . dexlansoprazole (DEXILANT) 60 MG capsule Take 1 capsule (60 mg total) by mouth daily. (Patient taking differently: Take 60 mg by mouth 2 (two) times daily. ) 90 capsule 1  . DULoxetine (CYMBALTA) 60 MG capsule Take 60 mg by mouth daily.    Marland Kitchen gabapentin (NEURONTIN) 600 MG tablet TAKE ONE TABLET (600 MG TOTAL) BY MOUTH 3 (THREE) TIMES A DAY.  2  . LORazepam (ATIVAN) 2 MG tablet 2 tabs po qhs 60 tablet 5  . Multiple Vitamin (MULTIVITAMIN) tablet Take 1 tablet  by mouth daily.    . pediatric multivitamin-iron (POLY-VI-SOL WITH IRON) 15 MG chewable tablet Chew 1 tablet by mouth daily.    . promethazine (PHENERGAN) 12.5 MG tablet 1-2 tabs po q6h prn nausea 30 tablet 3  . simvastatin (ZOCOR) 20 MG tablet TAKE 1 TABLET BY MOUTH EVERY DAY 90 tablet 1  . tiZANidine (ZANAFLEX) 4 MG tablet Take 4 mg by mouth once. Muscle relaxant     No facility-administered medications prior to visit.     Allergies  Allergen Reactions  . Codeine Anaphylaxis, Hives and Swelling  . Cyanocobalamin Shortness Of Breath, Palpitations and Other (See Comments)    Weak,nausea, vision disturbance  . Fentanyl Itching and Rash  . Meperidine Rash  . Nsaids Rash  . Propofol Other (See Comments)    "out of it all day" after  endoscopy  . Propoxyphene Rash  . Cefdinir Itching    Itching + "hot" neck rash  . Erythromycin Hives and Swelling    Pt states that she can take zpak  . Penicillins Hives  . Percocet [Oxycodone-Acetaminophen] Other (See Comments)    unknown  . Sulfa Antibiotics Hives and Swelling  . Tramadol Hives  . Vicodin [Hydrocodone-Acetaminophen] Other (See Comments)    unknown  . Cephalexin Other (See Comments)    Abdominal pain    ROS As per HPI  PE: Blood pressure 122/84, pulse 89, temperature 98.5 F (36.9 C), temperature source Oral, resp. rate 16, height 5\' 7"  (1.702 m), weight 193 lb (87.5 kg), SpO2 97 %. VS: noted--normal. Gen: alert, NAD, NONTOXIC APPEARING. HEENT: eyes without injection, drainage, or swelling.  Ears: EACs clear, TMs with normal light reflex and landmarks.  Nose: Clear rhinorrhea, with some dried, crusty exudate adherent to mildly injected mucosa.  No purulent d/c.  Diffuse paranasal and frontal sinus TTP.  No facial swelling.  Throat and mouth without focal lesion.  No pharyngial swelling, erythema, or exudate.   Neck: supple, no LAD.   LUNGS: CTA bilat, nonlabored resps.   CV: RRR, no m/r/g. EXT: no c/c/e SKIN: no rash   LABS:    Chemistry      Component Value Date/Time   NA 139 04/26/2018 1120   NA 137 03/15/2014   K 4.7 04/26/2018 1120   CL 104 04/26/2018 1120   CO2 29 04/26/2018 1120   BUN 13 04/26/2018 1120   BUN 13 03/15/2014   CREATININE 0.79 04/26/2018 1120      Component Value Date/Time   CALCIUM 10.2 04/26/2018 1120   ALKPHOS 89 11/15/2017 1435   AST 23 11/15/2017 1435   ALT 25 11/15/2017 1435   BILITOT 0.2 11/15/2017 1435     Lab Results  Component Value Date   IRON 64 04/26/2018   IRON 56 04/26/2018   TIBC 454 (H) 04/26/2018   FERRITIN 6.2 (L) 04/26/2018    Lab Results  Component Value Date   CHOL 196 01/05/2018   HDL 51.50 01/05/2018   LDLCALC 122 (H) 01/05/2018   TRIG 113.0 01/05/2018   CHOLHDL 4 01/05/2018      IMPRESSION AND PLAN:  URI, suspect viral. She has such chronic, generalized hyperalgesia that it is hard to interpret her pain and tenderness in the face as an accurate predictor for acute bacterial sinusitis.  I emphasized symptomatic care.  Will check sinus x-rays today. Instructions: Take over the counter, generic for allegra D and take as directed on packaging. Use saline nasal spray in your nose: 2-3 sprays in each nostril 2-3  times per day to irrigate and moisturize nasal passages.  An After Visit Summary was printed and given to the patient.  FOLLOW UP: Return for keep 07/28/18 apt already scheduled with me.at which time we'll monitor CBC and iron/ferritin for her relatively recent hx of iron def.  Signed:  Crissie Sickles, MD           07/07/2018

## 2018-07-07 NOTE — Patient Instructions (Signed)
Take over the counter, generic for allegra D and take as directed on packaging.  Use saline nasal spray in your nose: 2-3 sprays in each nostril 2-3 times per day to irrigate and moisturize nasal passages.

## 2018-07-08 ENCOUNTER — Encounter: Payer: Self-pay | Admitting: Family Medicine

## 2018-07-08 ENCOUNTER — Encounter: Payer: Self-pay | Admitting: *Deleted

## 2018-07-08 NOTE — Telephone Encounter (Signed)
See result note.  

## 2018-07-08 NOTE — Telephone Encounter (Signed)
Please advise. Thanks.  

## 2018-07-20 ENCOUNTER — Ambulatory Visit: Payer: 59 | Admitting: Allergy and Immunology

## 2018-07-21 ENCOUNTER — Ambulatory Visit: Payer: 59 | Admitting: Allergy and Immunology

## 2018-07-27 ENCOUNTER — Ambulatory Visit: Payer: Self-pay | Admitting: Allergy and Immunology

## 2018-07-28 ENCOUNTER — Ambulatory Visit: Payer: 59 | Admitting: Family Medicine

## 2018-07-29 DIAGNOSIS — M5020 Other cervical disc displacement, unspecified cervical region: Secondary | ICD-10-CM | POA: Diagnosis not present

## 2018-07-29 DIAGNOSIS — M50322 Other cervical disc degeneration at C5-C6 level: Secondary | ICD-10-CM | POA: Diagnosis not present

## 2018-08-01 ENCOUNTER — Encounter: Payer: Self-pay | Admitting: Family Medicine

## 2018-08-01 ENCOUNTER — Ambulatory Visit (INDEPENDENT_AMBULATORY_CARE_PROVIDER_SITE_OTHER): Payer: 59 | Admitting: Family Medicine

## 2018-08-01 VITALS — BP 117/78 | HR 83 | Temp 98.6°F | Resp 16 | Ht 67.0 in | Wt 190.2 lb

## 2018-08-01 DIAGNOSIS — E538 Deficiency of other specified B group vitamins: Secondary | ICD-10-CM | POA: Diagnosis not present

## 2018-08-01 DIAGNOSIS — R7301 Impaired fasting glucose: Secondary | ICD-10-CM

## 2018-08-01 DIAGNOSIS — E7800 Pure hypercholesterolemia, unspecified: Secondary | ICD-10-CM

## 2018-08-01 DIAGNOSIS — M5412 Radiculopathy, cervical region: Secondary | ICD-10-CM

## 2018-08-01 DIAGNOSIS — G8929 Other chronic pain: Secondary | ICD-10-CM

## 2018-08-01 DIAGNOSIS — F419 Anxiety disorder, unspecified: Secondary | ICD-10-CM

## 2018-08-01 DIAGNOSIS — D500 Iron deficiency anemia secondary to blood loss (chronic): Secondary | ICD-10-CM

## 2018-08-01 DIAGNOSIS — F329 Major depressive disorder, single episode, unspecified: Secondary | ICD-10-CM

## 2018-08-01 DIAGNOSIS — F5104 Psychophysiologic insomnia: Secondary | ICD-10-CM

## 2018-08-01 DIAGNOSIS — M542 Cervicalgia: Secondary | ICD-10-CM

## 2018-08-01 LAB — COMPREHENSIVE METABOLIC PANEL
ALT: 29 U/L (ref 0–35)
AST: 28 U/L (ref 0–37)
Albumin: 4.6 g/dL (ref 3.5–5.2)
Alkaline Phosphatase: 81 U/L (ref 39–117)
BUN: 12 mg/dL (ref 6–23)
CALCIUM: 9.8 mg/dL (ref 8.4–10.5)
CHLORIDE: 103 meq/L (ref 96–112)
CO2: 27 meq/L (ref 19–32)
Creatinine, Ser: 0.74 mg/dL (ref 0.40–1.20)
GFR: 85.84 mL/min (ref >60.00)
Glucose, Bld: 91 mg/dL (ref 70–99)
Potassium: 4.3 mEq/L (ref 3.5–5.1)
Sodium: 139 mEq/L (ref 135–145)
Total Bilirubin: 0.4 mg/dL (ref 0.2–1.2)
Total Protein: 7 g/dL (ref 6.0–8.3)

## 2018-08-01 LAB — CBC
HEMATOCRIT: 37.4 % (ref 36.0–46.0)
Hemoglobin: 12 g/dL (ref 12.0–15.0)
MCHC: 32.1 g/dL (ref 30.0–36.0)
MCV: 79 fl (ref 78.0–100.0)
Platelets: 386 10*3/uL (ref 150.0–400.0)
RBC: 4.74 Mil/uL (ref 3.87–5.11)
RDW: 16.9 % — ABNORMAL HIGH (ref 11.5–15.5)
WBC: 5.1 10*3/uL (ref 4.0–10.5)

## 2018-08-01 LAB — LIPID PANEL
CHOL/HDL RATIO: 4
Cholesterol: 220 mg/dL — ABNORMAL HIGH (ref 0–200)
HDL: 50.7 mg/dL (ref >39.00)
LDL CALC: 144 mg/dL — AB (ref 0–99)
NonHDL: 169.26
TRIGLYCERIDES: 125 mg/dL (ref 0.0–149.0)
VLDL: 25 mg/dL (ref 0.0–40.0)

## 2018-08-01 LAB — FERRITIN: Ferritin: 6.5 ng/mL — ABNORMAL LOW (ref 10.0–291.0)

## 2018-08-01 LAB — HEMOGLOBIN A1C: Hgb A1c MFr Bld: 6 % (ref 4.6–6.5)

## 2018-08-01 LAB — VITAMIN B12: Vitamin B-12: 384 pg/mL (ref 211–911)

## 2018-08-01 MED ORDER — LORAZEPAM 2 MG PO TABS: ORAL_TABLET | ORAL | 5 refills | Status: DC

## 2018-08-01 NOTE — Progress Notes (Signed)
OFFICE VISIT  08/01/2018   CC:  Chief Complaint  Patient presents with  . Follow-up    RCI/labs, pt is not fasting.    HPI:    Patient is a 57 y.o. Caucasian female who presents for 3 mo f/u vit B12 def, hx of IDA from recurrent hemorrhoid bleeding, HLD, IFG, and GAD and recurrent depression.  Getting c spine surgery at [facility] 09/14/18, Dr. Pamala Hurry is neurosurgeon.  Pain is by far her biggest concern from a health standpoint.  I emphasized that HER SURGEON needs to manage any post-operative pain issues.  If she has ongoing pain that requires any narcotics, she'll need to see a pain specialist.  Vit B12: after getting some IM b12 her level came up to mid 300s and she started oral b12 supp 01/2018. She is taking this supp qd.  IDA: I started her on slow Fe about 3 mo ago.  She had intolerable constipation on this and other iron tablets. She is only taking a MVI.  Still having some intermittent BRB from hemorrhoids as per her usual.  Anxiety/mood: stable mood, anxiety stable.  Insomnia stable.  No adverse effects from meds.  HLD/IFG: tolerating zocor 20mg  qd.  Not really changing much with her diet.  No exercise due to chronic pain.   ROS: no CP, no SOB, no wheezing, no cough, no dizziness, no HAs, no rashes, no melena/hematochezia.  No polyuria or polydipsia.     Past Medical History:  Diagnosis Date  . Arthritis   . Blood in stool    EGD and colonoscopy w/out source of bleeding 12/2017-->+ hemorrhoids-->GI suspects hemorrhoid dz as cause of blood in stool.  . Chronic post-traumatic stress disorder (PTSD)    + persistent complicated bereavement disorder (Dr. Doroteo Glassman, PhD, psychology).  . Complex regional pain syndrome    onset with broken R wrist; pain in both arms  . DDD (degenerative disc disease), cervical 06/2018   MRI: moderate to severe C6-7 neural foraminal stenosis, L>R--->to neurosurgeon.  . Depression    anx and dep  . Frequent headaches    migraine synd  . GAD  (generalized anxiety disorder)   . Gastric polyp 12/2017   Hyperplastic->Dr. outlaw considering removal in hosp after severe GERD fully addressed.  Marland Kitchen GERD (gastroesophageal reflux disease)    w/dysphagia and nausea.  Dr. Paulita Fujita: failed multiple PPIs, EGD normal except small hiatal hernia and 12 mm gastric polyp--48H esoph manometry planned as of 02/23/18.  [facility]-->FOURTH opinion 04/05/18: bid dexilant started.  Pt imprvd at f/u 05/31/18  . History of cellulitis    with abscess or oral soft tissues  . Hyperlipidemia   . IBS (irritable bowel syndrome)    Diarrhea  . Insomnia   . Insulin resistance   . Iron deficiency anemia 12/2017   11/2017.  Pt to get EGD/Colonoscopy by Dr. Paulita Fujita 01/2018.  . Nephrolithiasis    never had to have one extracted or lithotripsy  . Palpitations    PVCs: no medical therpay recommended by her cardiologist in [place], [place].  Marland Kitchen Recurrent UTI    approx 3 per year.  . Vitamin B12 deficiency    IF ab neg.  Intol B12 inj.  Started B12 oral 01/2018    Past Surgical History:  Procedure Laterality Date  . ABDOMINAL HYSTERECTOMY  age 65   endometriosis; also history of "severely abnormal pap"  . CESAREAN SECTION     X 2  . CHOLECYSTECTOMY  age 73  . COLONOSCOPY  2012  Normal  . COLONOSCOPY  01/18/2018   Normal: repeat 10 years  . ESOPHAGOGASTRODUODENOSCOPY  01/2018   Hyperplastic gastric polyp-->GI (Outlaw) considering polyp removal in hosp.  . LE venous doppler U/s  04/2011   No DVT  . NASAL SINUS SURGERY  age 45  . TONSILLECTOMY  age 78  . UMBILICAL HERNIA REPAIR  age 57    Outpatient Medications Prior to Visit  Medication Sig Dispense Refill  . dexlansoprazole (DEXILANT) 60 MG capsule Take 1 capsule (60 mg total) by mouth daily. (Patient taking differently: Take 60 mg by mouth 2 (two) times daily. ) 90 capsule 1  . DULoxetine (CYMBALTA) 60 MG capsule Take 60 mg by mouth daily.    Marland Kitchen gabapentin (NEURONTIN) 600 MG tablet TAKE ONE TABLET (600 MG TOTAL) BY  MOUTH 3 (THREE) TIMES A DAY.  2  . Multiple Vitamin (MULTIVITAMIN) tablet Take 1 tablet by mouth daily.    . pediatric multivitamin-iron (POLY-VI-SOL WITH IRON) 15 MG chewable tablet Chew 1 tablet by mouth daily.    . promethazine (PHENERGAN) 12.5 MG tablet 1-2 tabs po q6h prn nausea 30 tablet 3  . simvastatin (ZOCOR) 20 MG tablet TAKE 1 TABLET BY MOUTH EVERY DAY 90 tablet 1  . tiZANidine (ZANAFLEX) 4 MG tablet Take 4 mg by mouth once. Muscle relaxant    . LORazepam (ATIVAN) 2 MG tablet 2 tabs po qhs 60 tablet 5   No facility-administered medications prior to visit.     Allergies  Allergen Reactions  . Codeine Anaphylaxis, Hives and Swelling  . Cyanocobalamin Shortness Of Breath, Palpitations and Other (See Comments)    Weak,nausea, vision disturbance  . Fentanyl Itching and Rash  . Meperidine Rash  . Nsaids Rash  . Propofol Other (See Comments)    "out of it all day" after endoscopy  . Propoxyphene Rash  . Cefdinir Itching    Itching + "hot" neck rash  . Erythromycin Hives and Swelling    Pt states that she can take zpak  . Penicillins Hives  . Percocet [Oxycodone-Acetaminophen] Other (See Comments)    unknown  . Sulfa Antibiotics Hives and Swelling  . Tramadol Hives  . Vicodin [Hydrocodone-Acetaminophen] Other (See Comments)    unknown  . Cephalexin Other (See Comments)    Abdominal pain    ROS As per HPI  PE: Blood pressure 117/78, pulse 83, temperature 98.6 F (37 C), temperature source Oral, resp. rate 16, height 5\' 7"  (1.702 m), weight 190 lb 4 oz (86.3 kg), SpO2 97 %. Gen: Alert, well appearing.  Patient is oriented to person, place, time, and situation. AFFECT: pleasant, lucid thought and speech. CV: RRR, no m/r/g.   LUNGS: CTA bilat, nonlabored resps, good aeration in all lung fields. EXT: no clubbing or cyanosis.  no edema.    LABS:  Lab Results  Component Value Date   TSH 1.37 11/15/2017   Lab Results  Component Value Date   WBC 4.5 04/26/2018    HGB 12.4 04/26/2018   HCT 39.1 04/26/2018   MCV 78.1 04/26/2018   PLT 502.0 (H) 04/26/2018   Lab Results  Component Value Date   IRON 64 04/26/2018   IRON 56 04/26/2018   TIBC 454 (H) 04/26/2018   FERRITIN 6.2 (L) 04/26/2018    Lab Results  Component Value Date   CREATININE 0.79 04/26/2018   BUN 13 04/26/2018   NA 139 04/26/2018   K 4.7 04/26/2018   CL 104 04/26/2018   CO2 29 04/26/2018   Lab  Results  Component Value Date   ALT 25 11/15/2017   AST 23 11/15/2017   ALKPHOS 89 11/15/2017   BILITOT 0.2 11/15/2017   Lab Results  Component Value Date   CHOL 196 01/05/2018   Lab Results  Component Value Date   HDL 51.50 01/05/2018   Lab Results  Component Value Date   LDLCALC 122 (H) 01/05/2018   Lab Results  Component Value Date   TRIG 113.0 01/05/2018   Lab Results  Component Value Date   CHOLHDL 4 01/05/2018   Lab Results  Component Value Date   HGBA1C 5.9 01/20/2017   Lab Results  Component Value Date   VITAMINB12 297 04/26/2018    IMPRESSION AND PLAN:  1) Vit B12 def: came up with IM B12.  Intrinsic factor ab neg. Oral B12 the last 6 mo: recheck vit B12 level today.  2) IDA from hemorrhoidal blood loss: she cannot tolerate oral iron supplementation except for what little amount is in her MVI.  Her anemia has been very mild/low end of normal Hb.  If iron doesn't come up adequately, will need to get her in for IV iron.  3) HLD: tolerating statin.  Recheck FLP today.  CMET today.  4) IFG: needs to focus on lower carb/lower fat diet.  HbA1c recheck today. CMET today.  5) Recurrent MDD/GAD/insomnia: stable.  No changes in meds today. RF'd lorazepam 2mg  qhs today.  6) C spine DDD/DJD with radiculopathy: she will be getting surgery with a [facility] neurosurgeon 09/14/2018. Pain mgmt in postop period will have to be handled by her surgeon.  If pain med needed beyond post op period, she needs to establish with a pain mgmt MD.  An After Visit Summary was  printed and given to the patient.  FOLLOW UP: Return in about 3 months (around 11/01/2018) for routine chronic illness f/u.  Signed:  Crissie Sickles, MD           08/01/2018

## 2018-08-02 ENCOUNTER — Encounter: Payer: Self-pay | Admitting: Family Medicine

## 2018-08-02 ENCOUNTER — Encounter: Payer: Self-pay | Admitting: *Deleted

## 2018-08-02 DIAGNOSIS — E611 Iron deficiency: Secondary | ICD-10-CM | POA: Insufficient documentation

## 2018-08-07 DIAGNOSIS — E041 Nontoxic single thyroid nodule: Secondary | ICD-10-CM

## 2018-08-07 HISTORY — DX: Nontoxic single thyroid nodule: E04.1

## 2018-08-09 ENCOUNTER — Encounter: Payer: Self-pay | Admitting: Allergy and Immunology

## 2018-08-09 ENCOUNTER — Ambulatory Visit (INDEPENDENT_AMBULATORY_CARE_PROVIDER_SITE_OTHER): Payer: 59 | Admitting: Allergy and Immunology

## 2018-08-09 VITALS — BP 110/76 | HR 88 | Temp 98.6°F | Resp 20 | Ht 66.7 in | Wt 189.8 lb

## 2018-08-09 DIAGNOSIS — L501 Idiopathic urticaria: Secondary | ICD-10-CM

## 2018-08-09 DIAGNOSIS — J3089 Other allergic rhinitis: Secondary | ICD-10-CM

## 2018-08-09 DIAGNOSIS — K219 Gastro-esophageal reflux disease without esophagitis: Secondary | ICD-10-CM | POA: Diagnosis not present

## 2018-08-09 DIAGNOSIS — G4489 Other headache syndrome: Secondary | ICD-10-CM | POA: Diagnosis not present

## 2018-08-09 DIAGNOSIS — T50905D Adverse effect of unspecified drugs, medicaments and biological substances, subsequent encounter: Secondary | ICD-10-CM

## 2018-08-09 NOTE — Patient Instructions (Addendum)
  1.  Allergen avoidance measures?  2.  Every day utilize the following medications:   A.  Cetirizine 10 mg -1 tablet twice a day  3.  Consider ibuprofen or Mobic or Celebrex  4.  Slowly consolidate all forms of caffeine over the next month  5.  Return to clinic in 1 month or earlier if problem

## 2018-08-09 NOTE — Progress Notes (Signed)
Dear Dr. Anitra Lauth,  Thank you for referring Tracy Olsen to the [facility] on 08/09/2018.   Below is a summation of this patient's evaluation and recommendations.  Thank you for your referral. I will keep you informed about this patient's response to treatment.   If you have any questions please do not hesitate to contact me.   Sincerely,  Jiles Prows, MD Allergy / [place]   ______________________________________________________________________    NEW PATIENT NOTE  Referring Provider: Tammi Sou, MD Primary Provider: Tammi Sou, MD Date of office visit: 08/09/2018    Subjective:   Chief Complaint:  Tracy Olsen (DOB: 1960-11-02) is a 57 y.o. female who presents to the clinic on 08/09/2018 with a chief complaint of Urticaria and Angioedema (facial, legs) .     HPI: Brion presents to this clinic in evaluation of several issues.  First, she tells me that she has been having problems with itchiness for greater than a year.  She will develop itchy areas across her body and then she will excoriate her skin and then she will get capillary fragility with the development of red spots where she scratches especially on her lower extremities.  She believes that at the time that she gets extremely itchy she has also developed a swelling of her lower extremities and may be some redness as well.  She will take some Benadryl and this appears to help this issue somewhat.  This appears to occur on a daily basis but flares up real bad about every 2 weeks or so.  There is no obvious provoking factor giving rise to this issue.  She does not have any associated systemic or constitutional symptoms.  Second, she has had an episode of periorbital swelling 15 July 2018 while visiting friends in [place].  She had 2 episodes that occurred within 1 week.  There was redness of the skin  of her face along with this periorbital swelling.  She took some Benadryl for this issue and also took some Zyrtec.  Once again there was no other associated systemic or constitutional symptoms and no obvious trigger giving rise to this issue.  Third, she has an issue with "sinus".  Apparently she underwent sinus surgery in 2015 for chronic sinusitis.  She is much better since that point in time.  She does not have any anosmia at this point or ugly nasal discharge.  She still occasionally gets some nasal congestion and sneezing.  She does have headaches about twice a week that are in her periorbital region or frontal region that are pressure-like and sometimes are associated with the development of black spots for which she will take some Tylenol.  Fourth, she does have reflux that is active.  She drinks coffee in the morning and has green tea 3 times per day.  Fifth, she has had a problem with medications.  She has developed throat swelling with the use of hydrocodone.  She has developed itchiness and may be some hives with morphine in the past.  She has developed a rash at the age of 1 with penicillin after being treated for strep throat.  She has developed a rash with amoxicillin.  She has developed a rash with Keflex.  She can take Omnicef with no problem.  She cannot take erythromycin because of GI upset but she can take azithromycin with no problem.  Sixth, she has been  counseled not to take ibuprofen because of her stomach issue by her gastroenterologist.  Halford Decamp, she is scheduled to have neck surgery performed at [facility] on September 14, 2018   Past Medical History:  Diagnosis Date  . Arthritis   . Blood in stool    EGD and colonoscopy w/out source of bleeding 12/2017-->+ hemorrhoids-->GI suspects hemorrhoid dz as cause of blood in stool.  . Chronic post-traumatic stress disorder (PTSD)    + persistent complicated bereavement disorder (Dr. Doroteo Glassman, PhD, psychology).  . Complex  regional pain syndrome    onset with broken R wrist; pain in both arms  . DDD (degenerative disc disease), cervical 06/2018   MRI: moderate to severe C6-7 neural foraminal stenosis, L>R--->to neurosurgeon.  . Depression    anx and dep  . Frequent headaches    migraine synd  . GAD (generalized anxiety disorder)   . Gastric polyp 12/2017   Hyperplastic->Dr. outlaw considering removal in hosp after severe GERD fully addressed.  Marland Kitchen GERD (gastroesophageal reflux disease)    w/dysphagia and nausea.  Dr. Paulita Fujita: failed multiple PPIs, EGD normal except small hiatal hernia and 12 mm gastric polyp--48H esoph manometry planned as of 02/23/18.  [facility]-->FOURTH opinion 04/05/18: bid dexilant started.  Pt imprvd at f/u 05/31/18  . History of cellulitis    with abscess or oral soft tissues  . Hyperlipidemia   . IBS (irritable bowel syndrome)    Diarrhea  . Insomnia   . Insulin resistance   . Iron deficiency anemia 12/2017   11/2017.  Pt to get EGD/Colonoscopy by Dr. Paulita Fujita 01/2018.  Pt intolerant of oral iron-->plan for feraheme infusions as of 08/02/18.  . Nephrolithiasis    never had to have one extracted or lithotripsy  . Palpitations    PVCs: no medical therpay recommended by her cardiologist in [place], [place].  Marland Kitchen Recurrent UTI    approx 3 per year.  . Urticaria   . Vitamin B12 deficiency    IF ab neg.  Intol B12 inj.  Started B12 oral 01/2018    Past Surgical History:  Procedure Laterality Date  . ABDOMINAL HYSTERECTOMY  age 28   endometriosis; also history of "severely abnormal pap"  . CESAREAN SECTION     X 2  . CHOLECYSTECTOMY  age 62  . COLONOSCOPY  2012   Normal  . COLONOSCOPY  01/18/2018   Normal: repeat 10 years  . ESOPHAGOGASTRODUODENOSCOPY  01/2018   Hyperplastic gastric polyp-->GI ([facility]) considering polyp removal in hosp.  . LE venous doppler U/s  04/2011   No DVT  . NASAL SINUS SURGERY  age 17  . TONSILLECTOMY  age 82  . UMBILICAL HERNIA REPAIR  age 71    Allergies as  of 08/09/2018      Reactions   Codeine Anaphylaxis, Hives, Swelling   Cyanocobalamin Shortness Of Breath, Palpitations, Other (See Comments)   Weak,nausea, vision disturbance   Fentanyl Itching, Rash   Meperidine Rash   Nsaids Rash   Propofol Other (See Comments)   "out of it all day" after endoscopy   Propoxyphene Rash   Cefdinir Itching   Itching + "hot" neck rash   Erythromycin Hives, Swelling   Pt states that she can take zpak   Penicillins Hives   Percocet [oxycodone-acetaminophen] Other (See Comments)   unknown   Sulfa Antibiotics Hives, Swelling   Tramadol Hives   Vicodin [hydrocodone-acetaminophen] Other (See Comments)   unknown   Cephalexin Other (See Comments)   Abdominal pain  Medication List      dexlansoprazole 60 MG capsule Commonly known as:  DEXILANT Take 1 capsule (60 mg total) by mouth daily.   DULoxetine 60 MG capsule Commonly known as:  CYMBALTA Take 60 mg by mouth daily.   gabapentin 600 MG tablet Commonly known as:  NEURONTIN TAKE ONE TABLET (600 MG TOTAL) BY MOUTH 3 (THREE) TIMES A DAY.   LORazepam 2 MG tablet Commonly known as:  ATIVAN 2 tabs po qhs   multivitamin tablet Take 1 tablet by mouth daily.   pediatric multivitamin-iron 15 MG chewable tablet Chew 1 tablet by mouth daily.   promethazine 12.5 MG tablet Commonly known as:  PHENERGAN 1-2 tabs po q6h prn nausea   simvastatin 20 MG tablet Commonly known as:  ZOCOR TAKE 1 TABLET BY MOUTH EVERY DAY   tiZANidine 4 MG tablet Commonly known as:  ZANAFLEX Take 4 mg by mouth once. Muscle relaxant       Review of systems negative except as noted in HPI / PMHx or noted below:  Review of Systems  Constitutional: Negative.   HENT: Negative.   Eyes: Negative.   Respiratory: Negative.   Cardiovascular: Negative.   Gastrointestinal: Negative.   Genitourinary: Negative.   Musculoskeletal: Negative.   Skin: Negative.   Neurological: Negative.   Endo/Heme/Allergies:  Negative.   Psychiatric/Behavioral: Negative.     Family History  Problem Relation Age of Onset  . Arthritis Mother   . Rheum arthritis Mother   . Heart disease Mother   . Hypertension Mother   . Kidney disease Mother   . Raynaud syndrome Mother   . Multiple sclerosis Mother   . Lupus Mother   . Prostate cancer Father   . Heart disease Maternal Uncle   . Hypertension Maternal Uncle   . Hyperlipidemia Maternal Uncle   . Arthritis Maternal Grandmother   . Rheum arthritis Maternal Grandmother   . Kidney disease Maternal Grandmother   . Breast cancer Maternal Grandmother   . Heart disease Maternal Grandfather   . Diabetes Maternal Grandfather   . Heart disease Paternal Grandmother   . Hyperlipidemia Paternal Grandmother   . Kidney disease Maternal Uncle   . Prostate cancer Maternal Uncle     Social History   Socioeconomic History  . Marital status: Married    Spouse name: Not on file  . Number of children: Not on file  . Years of education: Not on file  . Highest education level: Not on file  Occupational History  . Not on file  Social Needs  . Financial resource strain: Not on file  . Food insecurity:    Worry: Not on file    Inability: Not on file  . Transportation needs:    Medical: Not on file    Non-medical: Not on file  Tobacco Use  . Smoking status: Never Smoker  . Smokeless tobacco: Never Used  Substance and Sexual Activity  . Alcohol use: Yes    Comment: rarely  . Drug use: No  . Sexual activity: Not on file  Lifestyle  . Physical activity:    Days per week: Not on file    Minutes per session: Not on file  . Stress: Not on file  Relationships  . Social connections:    Talks on phone: Not on file    Gets together: Not on file    Attends religious service: Not on file    Active member of club or organization: Not on file  Attends meetings of clubs or organizations: Not on file    Relationship status: Not on file  . Intimate partner violence:      Fear of current or ex partner: Not on file    Emotionally abused: Not on file    Physically abused: Not on file    Forced sexual activity: Not on file  Other Topics Concern  . Not on file  Social History Narrative   Married, 2 children (adults)   Educ: 4 yrs college.   Occ: housewife.   No tob/rarely alcohol.       Environmental and Social history  Lives in a house with a dry environment, carpet in the bedroom, no plastic on the bed, plastic on the pillow, no smokers located to the household.  She is a Agricultural engineer.  Objective:   Vitals:   08/09/18 1015  BP: 110/76  Pulse: 88  Resp: 20  Temp: 98.6 F (37 C)  SpO2: 97%   Height: 5' 6.7" (169.4 cm) Weight: 189 lb 12.8 oz (86.1 kg)  Physical Exam  HENT:  Head: Normocephalic.  Right Ear: Tympanic membrane, external ear and ear canal normal.  Left Ear: Tympanic membrane, external ear and ear canal normal.  Nose: Nose normal. No mucosal edema or rhinorrhea.  Mouth/Throat: Uvula is midline, oropharynx is clear and moist and mucous membranes are normal. No oropharyngeal exudate.  Eyes: Conjunctivae are normal.  Neck: Trachea normal. No tracheal tenderness present. No tracheal deviation present. No thyromegaly present.  Cardiovascular: Normal rate, regular rhythm, S1 normal, S2 normal and normal heart sounds.  No murmur heard. Pulmonary/Chest: Breath sounds normal. No stridor. No respiratory distress. She has no wheezes. She has no rales.  Musculoskeletal: She exhibits no edema.  Lymphadenopathy:       Head (right side): No tonsillar adenopathy present.       Head (left side): No tonsillar adenopathy present.    She has no cervical adenopathy.  Neurological: She is alert.  Skin: No rash noted. She is not diaphoretic. No erythema. Nails show no clubbing.    Diagnostics: Allergy skin tests were performed.  She did not demonstrate any hypersensitivity against a screening panel of aeroallergens or foods.  Assessment and  Plan:    1. Perennial allergic rhinitis   2. Idiopathic urticaria   3. Chronic mixed headache syndrome   4. Gastroesophageal reflux disease, esophagitis presence not specified   5. Adverse effect of drug, subsequent encounter     1.  Allergen avoidance measures?  2.  Every day utilize the following medications:   A.  Cetirizine 10 mg -1 tablet twice a day  3.  Consider ibuprofen or Mobic or Celebrex  4.  Slowly consolidate all forms of caffeine over the next month  5.  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  I will review all the blood test that been performed in the past year prior to proceeding with any further evaluation for a source of her immunological hyperreactivity.  She has several other issues as well.  She does appear to have reflux and headaches and I have asked her to taper down her caffeine to address both of these issues.  She appears to have adverse drug reactions especially directed against oxycodone and penicillin based medications and she  should not have these medications administered in the future.  It sounds as though she cannot take second and third generation cephalosporins and azithromycin without any problem.  She may require some narcotic-based medications for her upcoming spinal surgery and hopefully she will not develop significant itchiness or urticaria while using morphine at relatively low doses.  I am going to see her back in this clinic to address her issues and to consider further evaluation and treatment based upon her response.  Jiles Prows, MD Allergy / Immunology [facility]

## 2018-08-10 ENCOUNTER — Encounter: Payer: Self-pay | Admitting: Allergy and Immunology

## 2018-08-10 ENCOUNTER — Ambulatory Visit: Payer: Self-pay | Admitting: Allergy and Immunology

## 2018-08-12 ENCOUNTER — Encounter: Payer: Self-pay | Admitting: Family Medicine

## 2018-08-19 ENCOUNTER — Encounter: Payer: Self-pay | Admitting: Family Medicine

## 2018-08-19 NOTE — Telephone Encounter (Signed)
Please advise. Thanks.  

## 2018-08-19 NOTE — Telephone Encounter (Signed)
I recommend that she NOT get any testing through "lifetime screening centers" in the future, b/c they are USUALLY not accurate and often end up with results that are inaccurate.  They are basically money pits. Before I will order any imaging I need to see the paperwork showing what the screener saw.  Once I see this, I can determine the best imaging test to get (an ultrasound is usually better than a CT scan for imaging the thyroid gland).  If she cannot show me any paperwork with results, then she can ask [facility] to do the imaging that they would like done. -thx

## 2018-08-22 DIAGNOSIS — Z79899 Other long term (current) drug therapy: Secondary | ICD-10-CM | POA: Insufficient documentation

## 2018-08-22 DIAGNOSIS — M47812 Spondylosis without myelopathy or radiculopathy, cervical region: Secondary | ICD-10-CM | POA: Diagnosis not present

## 2018-08-22 DIAGNOSIS — Z87898 Personal history of other specified conditions: Secondary | ICD-10-CM | POA: Diagnosis not present

## 2018-08-22 DIAGNOSIS — G894 Chronic pain syndrome: Secondary | ICD-10-CM | POA: Diagnosis not present

## 2018-08-22 NOTE — Telephone Encounter (Signed)
Appt is fine BUT it needs to be 30 min preferable, but If I only have a 15 minute slot, then emphasize that we will not discuss anything other than the issue with her recent testing and the immediate next plans-->pls make sure she is aware of this plan!-thx!

## 2018-08-22 NOTE — Telephone Encounter (Signed)
See my response to the other MyChart message.

## 2018-08-22 NOTE — Telephone Encounter (Signed)
Please advise. Thanks.  

## 2018-08-22 NOTE — Telephone Encounter (Signed)
Pt was able to schedule apt with [facility]. Apt made for 08/23/18 at 9:30am (97min).

## 2018-08-23 ENCOUNTER — Ambulatory Visit: Payer: 59 | Admitting: Family Medicine

## 2018-08-23 ENCOUNTER — Other Ambulatory Visit (HOSPITAL_COMMUNITY): Payer: Self-pay | Admitting: *Deleted

## 2018-08-23 DIAGNOSIS — R1310 Dysphagia, unspecified: Secondary | ICD-10-CM | POA: Diagnosis not present

## 2018-08-23 DIAGNOSIS — Z01818 Encounter for other preprocedural examination: Secondary | ICD-10-CM | POA: Diagnosis not present

## 2018-08-23 DIAGNOSIS — E041 Nontoxic single thyroid nodule: Secondary | ICD-10-CM | POA: Diagnosis not present

## 2018-08-23 DIAGNOSIS — R0609 Other forms of dyspnea: Secondary | ICD-10-CM | POA: Diagnosis not present

## 2018-08-23 NOTE — Telephone Encounter (Signed)
FYI

## 2018-08-24 ENCOUNTER — Telehealth: Payer: Self-pay | Admitting: Family Medicine

## 2018-08-24 ENCOUNTER — Encounter (HOSPITAL_COMMUNITY)
Admission: RE | Admit: 2018-08-24 | Discharge: 2018-08-24 | Disposition: A | Discharge status: Home / Self Care | Payer: 59 | Source: Ambulatory Visit | Attending: Family Medicine | Admitting: Family Medicine

## 2018-08-24 DIAGNOSIS — E611 Iron deficiency: Secondary | ICD-10-CM | POA: Diagnosis present

## 2018-08-24 MED ORDER — DIPHENHYDRAMINE HCL 25 MG PO CAPS
ORAL_CAPSULE | ORAL | Status: AC
  Administered 2018-08-24: 25 mg via ORAL
  Filled 2018-08-24: qty 1

## 2018-08-24 MED ORDER — DIPHENHYDRAMINE HCL 25 MG PO CAPS
25.0000 mg | ORAL_CAPSULE | ORAL | Status: DC
  Administered 2018-08-24: 25 mg via ORAL

## 2018-08-24 MED ORDER — SODIUM CHLORIDE 0.9 % IV SOLN
510.0000 mg | INTRAVENOUS | Status: DC
  Administered 2018-08-24: 510 mg via INTRAVENOUS
  Filled 2018-08-24: qty 510

## 2018-08-24 NOTE — Telephone Encounter (Signed)
Melanie called from Short Stay to find out if Dr Anitra Lauth approves patient to have benadryl prior to infusion since she has many allergies to medications.  Dr Anitra Lauth approved benadryl PO 25 mg.  Threasa Beards advised, she will give patient dose now.

## 2018-08-24 NOTE — Discharge Instructions (Signed)

## 2018-08-24 NOTE — Telephone Encounter (Signed)
Agree 

## 2018-08-25 ENCOUNTER — Encounter: Payer: Self-pay | Admitting: Family Medicine

## 2018-08-25 NOTE — Telephone Encounter (Signed)
Nira Conn, can you get me whatever imaging she is talking about?-thx (I guess it was done by [facility] ? ?)

## 2018-08-25 NOTE — Telephone Encounter (Signed)
FYI

## 2018-08-25 NOTE — Telephone Encounter (Signed)
I found it in [facility].

## 2018-08-29 ENCOUNTER — Encounter: Payer: Self-pay | Admitting: Family Medicine

## 2018-09-01 ENCOUNTER — Encounter (HOSPITAL_COMMUNITY): Payer: 59

## 2018-09-02 ENCOUNTER — Encounter: Payer: Self-pay | Admitting: Family Medicine

## 2018-09-02 NOTE — Telephone Encounter (Signed)
Please advise. Thanks.  

## 2018-09-02 NOTE — Telephone Encounter (Signed)
I recommend she keep with the plan of getting the 2nd iron infusion.-thx

## 2018-09-05 DIAGNOSIS — R0600 Dyspnea, unspecified: Secondary | ICD-10-CM

## 2018-09-05 DIAGNOSIS — R0609 Other forms of dyspnea: Secondary | ICD-10-CM

## 2018-09-05 HISTORY — PX: CARDIOVASCULAR STRESS TEST: SHX262

## 2018-09-05 HISTORY — DX: Other forms of dyspnea: R06.09

## 2018-09-05 HISTORY — DX: Dyspnea, unspecified: R06.00

## 2018-09-06 ENCOUNTER — Encounter (HOSPITAL_COMMUNITY)
Admission: RE | Admit: 2018-09-06 | Discharge: 2018-09-06 | Disposition: A | Discharge status: Home / Self Care | Payer: 59 | Source: Ambulatory Visit | Attending: Family Medicine | Admitting: Family Medicine

## 2018-09-06 ENCOUNTER — Encounter: Payer: Self-pay | Admitting: *Deleted

## 2018-09-06 DIAGNOSIS — E611 Iron deficiency: Secondary | ICD-10-CM | POA: Diagnosis not present

## 2018-09-06 MED ORDER — SODIUM CHLORIDE 0.9 % IV SOLN
510.0000 mg | INTRAVENOUS | Status: DC
  Administered 2018-09-06: 510 mg via INTRAVENOUS
  Filled 2018-09-06: qty 17

## 2018-09-06 MED ORDER — DIPHENHYDRAMINE HCL 25 MG PO CAPS
ORAL_CAPSULE | ORAL | Status: AC
  Filled 2018-09-06: qty 1

## 2018-09-06 MED ORDER — DIPHENHYDRAMINE HCL 25 MG PO CAPS
25.0000 mg | ORAL_CAPSULE | ORAL | Status: DC
  Administered 2018-09-06: 25 mg via ORAL

## 2018-09-09 DIAGNOSIS — E041 Nontoxic single thyroid nodule: Secondary | ICD-10-CM | POA: Diagnosis not present

## 2018-09-12 ENCOUNTER — Encounter: Payer: Self-pay | Admitting: Family Medicine

## 2018-09-15 DIAGNOSIS — E041 Nontoxic single thyroid nodule: Secondary | ICD-10-CM | POA: Diagnosis not present

## 2018-09-21 ENCOUNTER — Encounter: Payer: Self-pay | Admitting: Family Medicine

## 2018-09-21 NOTE — Telephone Encounter (Signed)
I'm glad her thyroid biopsy was benign. Nothing needs to be done prior to getting her surgery.

## 2018-09-21 NOTE — Telephone Encounter (Signed)
FYI

## 2018-10-15 ENCOUNTER — Encounter: Payer: Self-pay | Admitting: Family Medicine

## 2018-10-16 ENCOUNTER — Encounter: Payer: Self-pay | Admitting: Family Medicine

## 2018-10-17 ENCOUNTER — Encounter: Payer: Self-pay | Admitting: Family Medicine

## 2018-10-17 ENCOUNTER — Ambulatory Visit (INDEPENDENT_AMBULATORY_CARE_PROVIDER_SITE_OTHER): Payer: 59 | Admitting: Family Medicine

## 2018-10-17 VITALS — BP 109/74 | HR 98 | Temp 99.2°F | Resp 16 | Ht 67.0 in | Wt 177.5 lb

## 2018-10-17 DIAGNOSIS — R69 Illness, unspecified: Secondary | ICD-10-CM | POA: Diagnosis not present

## 2018-10-17 DIAGNOSIS — J111 Influenza due to unidentified influenza virus with other respiratory manifestations: Secondary | ICD-10-CM

## 2018-10-17 LAB — POC INFLUENZA A&B (BINAX/QUICKVUE)
Influenza A, POC: NEGATIVE
Influenza B, POC: NEGATIVE

## 2018-10-17 MED ORDER — OSELTAMIVIR PHOSPHATE 75 MG PO CAPS: 75.0000 mg | ORAL_CAPSULE | Freq: Two times a day (BID) | ORAL | 0 refills | Status: DC

## 2018-10-17 NOTE — Telephone Encounter (Signed)
Please advise. Thanks.  

## 2018-10-17 NOTE — Patient Instructions (Addendum)
Your flu test was negative- however, I believe it is a false negative and elect to treat.  Rest, hydrate.  + flonase, mucinex (DM if cough), nettie pot or nasal saline.  tamiflu prescribed, take until completed.  If cough present it can last up to 6-8 weeks.  F/U 2 weeks of not improved.    Influenza, Adult Influenza is also called "the flu." It is an infection in the lungs, nose, and throat (respiratory tract). It is caused by a virus. The flu causes symptoms that are similar to symptoms of a cold. It also causes a high fever and body aches. The flu spreads easily from person to person (is contagious). Getting a flu shot (influenza vaccination) every year is the best way to prevent the flu. What are the causes? This condition is caused by the influenza virus. You can get the virus by:  Breathing in droplets that are in the air from the cough or sneeze of a person who has the virus.  Touching something that has the virus on it (is contaminated) and then touching your mouth, nose, or eyes. What increases the risk? Certain things may make you more likely to get the flu. These include:  Not washing your hands often.  Having close contact with many people during cold and flu season.  Touching your mouth, eyes, or nose without first washing your hands.  Not getting a flu shot every year. You may have a higher risk for the flu, along with serious problems such as a lung infection (pneumonia), if you:  Are older than 65.  Are pregnant.  Have a weakened disease-fighting system (immune system) because of a disease or taking certain medicines.  Have a long-term (chronic) illness, such as: ? Heart, kidney, or lung disease. ? Diabetes. ? Asthma.  Have a liver disorder.  Are very overweight (morbidly obese).  Have anemia. This is a condition that affects your red blood cells. What are the signs or symptoms? Symptoms usually begin suddenly and last 4-14 days. They may  include:  Fever and chills.  Headaches, body aches, or muscle aches.  Sore throat.  Cough.  Runny or stuffy (congested) nose.  Chest discomfort.  Not wanting to eat as much as normal (poor appetite).  Weakness or feeling tired (fatigue).  Dizziness.  Feeling sick to your stomach (nauseous) or throwing up (vomiting). How is this treated? If the flu is found early, you can be treated with medicine that can help reduce how bad the illness is and how long it lasts (antiviral medicine). This may be given by mouth (orally) or through an IV tube. Taking care of yourself at home can help your symptoms get better. Your doctor may suggest:  Taking over-the-counter medicines.  Drinking plenty of fluids. The flu often goes away on its own. If you have very bad symptoms or other problems, you may be treated in a hospital. Follow these instructions at home:     Activity  Rest as needed. Get plenty of sleep.  Stay home from work or school as told by your doctor. ? Do not leave home until you do not have a fever for 24 hours without taking medicine. ? Leave home only to visit your doctor. Eating and drinking  Take an ORS (oral rehydration solution). This is a drink that is sold at pharmacies and stores.  Drink enough fluid to keep your pee (urine) pale yellow.  Drink clear fluids in small amounts as you are able. Clear fluids include: ?  Water. ? Ice chips. ? Fruit juice that has water added (diluted fruit juice). ? Low-calorie sports drinks.  Eat bland, easy-to-digest foods in small amounts as you are able. These foods include: ? Bananas. ? Applesauce. ? Rice. ? Lean meats. ? Toast. ? Crackers.  Do not eat or drink: ? Fluids that have a lot of sugar or caffeine. ? Alcohol. ? Spicy or fatty foods. General instructions  Take over-the-counter and prescription medicines only as told by your doctor.  Use a cool mist humidifier to add moisture to the air in your home.  This can make it easier for you to breathe.  Cover your mouth and nose when you cough or sneeze.  Wash your hands with soap and water often, especially after you cough or sneeze. If you cannot use soap and water, use alcohol-based hand sanitizer.  Keep all follow-up visits as told by your doctor. This is important. How is this prevented?   Get a flu shot every year. You may get the flu shot in late summer, fall, or winter. Ask your doctor when you should get your flu shot.  Avoid contact with people who are sick during fall and winter (cold and flu season). Contact a doctor if:  You get new symptoms.  You have: ? Chest pain. ? Watery poop (diarrhea). ? A fever.  Your cough gets worse.  You start to have more mucus.  You feel sick to your stomach.  You throw up. Get help right away if you:  Have shortness of breath.  Have trouble breathing.  Have skin or nails that turn a bluish color.  Have very bad pain or stiffness in your neck.  Get a sudden headache.  Get sudden pain in your face or ear.  Cannot eat or drink without throwing up. Summary  Influenza ("the flu") is an infection in the lungs, nose, and throat. It is caused by a virus.  Take over-the-counter and prescription medicines only as told by your doctor.  Getting a flu shot every year is the best way to avoid getting the flu. This information is not intended to replace advice given to you by your health care provider. Make sure you discuss any questions you have with your health care provider. Document Released: 06/02/2008 Document Revised: 02/09/2018 Document Reviewed: 02/09/2018 Elsevier Interactive Patient Education  2019 [employer].

## 2018-10-17 NOTE — Progress Notes (Signed)
Tracy Olsen , 07/14/1961, 58 y.o., female MRN: [id number] Patient Care Team    Relationship Specialty Notifications Start End  McGowen, Adrian Blackwater, MD PCP - General Family Medicine  10/23/16   Doroteo Glassman, MD Consulting Physician Psychology  11/06/16   Wallene Huh, [place] Consulting Physician Podiatry  08/09/17   Arta Silence, MD Consulting Physician Gastroenterology  10/05/17   Malissa Hippo, Gaspar Skeeters, MD Consulting Physician Gastroenterology  06/05/18     Chief Complaint  Patient presents with  . Cough    x1 day, non productive   . Fever    x1 day, 100.9 has been the highest fever   . Headache    x1 day     Subjective: Pt presents for an OV with complaints of cough of 1 day duration. Pt is new to this provider-PCP McGowen.  Associated symptoms include fever (tmax 100.16F), decreased appetite and headache.  She has not had her flu shot this year. Pt has tried robitussin, cough drops, dayquil, nyquil, hydration to ease their symptoms.    Depression screen [facility] 2/9 04/26/2018 11/15/2017 01/20/2017  Decreased Interest 0 3 3  Down, Depressed, Hopeless 1 1 2   PHQ - 2 Score 1 4 5   Altered sleeping 0 1 2  Tired, decreased energy 2 3 3   Change in appetite 0 1 1  Feeling bad or failure about yourself  0 0 0  Trouble concentrating 0 0 0  Moving slowly or fidgety/restless 0 0 0  Suicidal thoughts 0 0 0  PHQ-9 Score 3 9 11   Difficult doing work/chores Not difficult at all Somewhat difficult Very difficult    Allergies  Allergen Reactions  . Codeine Anaphylaxis, Hives and Swelling  . Cyanocobalamin Shortness Of Breath, Palpitations and Other (See Comments)    Weak,nausea, vision disturbance  . Fentanyl Itching and Rash  . Meperidine Rash  . Nsaids Rash  . Propofol Other (See Comments)    "out of it all day" after endoscopy  . Propoxyphene Rash  . Cefdinir Itching    Itching + "hot" neck rash  . Erythromycin Hives and Swelling    Pt states that she can take zpak  . Penicillins  Hives  . Percocet [Oxycodone-Acetaminophen] Other (See Comments)    unknown  . Sulfa Antibiotics Hives and Swelling  . Tramadol Hives  . Vicodin [Hydrocodone-Acetaminophen] Other (See Comments)    unknown  . Cephalexin Other (See Comments)    Abdominal pain   Social History   Social History Narrative   Married, 2 children (adults)   Educ: 4 yrs college.   Occ: housewife.   No tob/rarely alcohol.      Past Medical History:  Diagnosis Date  . Arthritis   . Blood in stool    EGD and colonoscopy w/out source of bleeding 12/2017-->+ hemorrhoids-->GI suspects hemorrhoid dz as cause of blood in stool.  . Chronic post-traumatic stress disorder (PTSD)    + persistent complicated bereavement disorder (Dr. Doroteo Glassman, PhD, psychology).  . Complex regional pain syndrome    onset with broken R wrist; pain in both arms  . DDD (degenerative disc disease), cervical 06/2018   MRI: moderate to severe C6-7 neural foraminal stenosis, L>R--->to neurosurgeon.  . Depression    anx and dep  . DOE (dyspnea on exertion) 09/05/2018   Stress echo 09/05/18 via [facility].-->NORMAL, EF 55% at rest and with exercise, no ischemia, no valvular abnormalities.  . Frequent headaches    migraine synd  . GAD (  generalized anxiety disorder)   . Gastric polyp 12/2017   Hyperplastic->Dr. outlaw considering removal in hosp after severe GERD fully addressed.  Marland Kitchen GERD (gastroesophageal reflux disease)    w/dysphagia and nausea.  Dr. Paulita Fujita: failed multiple PPIs, EGD normal except small hiatal hernia and 12 mm gastric polyp--48H esoph manometry planned as of 02/23/18.  [facility]-->FOURTH opinion 04/05/18: bid dexilant started.  Pt imprvd at f/u 05/31/18  . History of cellulitis    with abscess or oral soft tissues  . Hyperlipidemia   . IBS (irritable bowel syndrome)    Diarrhea  . Insomnia   . Insulin resistance   . Iron deficiency anemia 12/2017   11/2017.  Pt to get EGD/Colonoscopy by Dr. Paulita Fujita  01/2018.  Pt intolerant of oral iron-->plan for feraheme infusions as of 08/02/18.  . Nephrolithiasis    never had to have one extracted or lithotripsy  . Palpitations    PVCs: no medical therpay recommended by her cardiologist in [place], [place].  Marland Kitchen Recurrent UTI    approx 3 per year.  . Thyroid nodule 08/2018   Left lobe 1.8 cm nodule bx'd by [facility] endocrine surgery 09/2018-->benign.  Also has left lobe 1.4 cm nodule that needs f/u ultrasound in 1 yr.  . Urticaria   . Vitamin B12 deficiency    IF ab neg.  Intol B12 inj.  Started B12 oral 01/2018   Past Surgical History:  Procedure Laterality Date  . ABDOMINAL HYSTERECTOMY  age 35   endometriosis; also history of "severely abnormal pap"  . CARDIOVASCULAR STRESS TEST  09/05/2018   Poor sound transmission; definity IV used. Normal Stress ECHO. Trace TR. No prior study with exercise, no ischemic symptoms, ECG changes, wall motion abnormalities  . CESAREAN SECTION     X 2  . CHOLECYSTECTOMY  age 71  . COLONOSCOPY  2012   Normal  . COLONOSCOPY  01/18/2018   Normal: repeat 10 years  . ESOPHAGOGASTRODUODENOSCOPY  01/2018   Hyperplastic gastric polyp-->GI ([facility]) considering polyp removal in hosp.  . LE venous doppler U/s  04/2011   No DVT  . NASAL SINUS SURGERY  age 49  . TONSILLECTOMY  age 81  . UMBILICAL HERNIA REPAIR  age 49   Family History  Problem Relation Age of Onset  . Arthritis Mother   . Rheum arthritis Mother   . Heart disease Mother   . Hypertension Mother   . Kidney disease Mother   . Raynaud syndrome Mother   . Multiple sclerosis Mother   . Lupus Mother   . Prostate cancer Father   . Heart disease Maternal Uncle   . Hypertension Maternal Uncle   . Hyperlipidemia Maternal Uncle   . Arthritis Maternal Grandmother   . Rheum arthritis Maternal Grandmother   . Kidney disease Maternal Grandmother   . Breast cancer Maternal Grandmother   . Heart disease Maternal Grandfather   . Diabetes Maternal Grandfather   .  Heart disease Paternal Grandmother   . Hyperlipidemia Paternal Grandmother   . Kidney disease Maternal Uncle   . Prostate cancer Maternal Uncle    Allergies as of 10/17/2018      Reactions   Codeine Anaphylaxis, Hives, Swelling   Cyanocobalamin Shortness Of Breath, Palpitations, Other (See Comments)   Weak,nausea, vision disturbance   Fentanyl Itching, Rash   Meperidine Rash   Nsaids Rash   Propofol Other (See Comments)   "out of it all day" after endoscopy   Propoxyphene Rash   Cefdinir Itching  Itching + "hot" neck rash   Erythromycin Hives, Swelling   Pt states that she can take zpak   Penicillins Hives   Percocet [oxycodone-acetaminophen] Other (See Comments)   unknown   Sulfa Antibiotics Hives, Swelling   Tramadol Hives   Vicodin [hydrocodone-acetaminophen] Other (See Comments)   unknown   Cephalexin Other (See Comments)   Abdominal pain      Medication List       Accurate as of October 17, 2018  3:57 PM. Always use your most recent med list.        dexlansoprazole 60 MG capsule Commonly known as:  DEXILANT Take 1 capsule (60 mg total) by mouth daily.   DULoxetine 60 MG capsule Commonly known as:  CYMBALTA Take 60 mg by mouth daily.   gabapentin 600 MG tablet Commonly known as:  NEURONTIN TAKE ONE TABLET (600 MG TOTAL) BY MOUTH 3 (THREE) TIMES A DAY.   LORazepam 2 MG tablet Commonly known as:  ATIVAN 2 tabs po qhs   multivitamin tablet Take 1 tablet by mouth daily.   oseltamivir 75 MG capsule Commonly known as:  TAMIFLU Take 1 capsule (75 mg total) by mouth 2 (two) times daily.   pediatric multivitamin-iron 15 MG chewable tablet Chew 1 tablet by mouth daily.   promethazine 12.5 MG tablet Commonly known as:  PHENERGAN 1-2 tabs po q6h prn nausea   simvastatin 20 MG tablet Commonly known as:  ZOCOR TAKE 1 TABLET BY MOUTH EVERY DAY   tiZANidine 4 MG tablet Commonly known as:  ZANAFLEX Take 4 mg by mouth once. Muscle relaxant       All  past medical history, surgical history, allergies, family history, immunizations andmedications were updated in the EMR today and reviewed under the history and medication portions of their EMR.     ROS: Negative, with the exception of above mentioned in HPI   Objective:  BP 109/74 (BP Location: Left Arm, Patient Position: Sitting, Cuff Size: Normal)   Pulse 98   Temp 99.2 F (37.3 C) (Oral)   Resp 16   Ht 5\' 7"  (1.702 m)   Wt 177 lb 8 oz (80.5 kg)   SpO2 96%   BMI 27.80 kg/m  Body mass index is 27.8 kg/m. Gen: febrile. No acute distress. Nontoxic in appearance, well developed, well nourished.  HENT: AT. [facility]. Bilateral TM visualized left TM with mild fullness, no erythema. MMM, no oral lesions. Bilateral naresswelling, erythema, drainage.  Throat without erythema or exudates. Cough, hoarseness, PND present.  Eyes:Pupils Equal Round Reactive to light, Extraocular movements intact,  Conjunctiva without redness, discharge or icterus. Neck/lymp/endocrine: Supple,no lymphadenopathy CV: RRR  Chest: CTAB, no wheeze or crackles.  Abd: Soft.  NTND. BS present Skin: no rashes, purpura or petechiae.  Neuro: Normal gait. PERLA. EOMi. Alert. Oriented x3    No exam data present No results found. Results for orders placed or performed in visit on 10/17/18 (from the past 24 hour(s))  POC Influenza A&B(BINAX/QUICKVUE)     Status: Normal   Collection Time: 10/17/18  3:24 PM  Result Value Ref Range   Influenza A, POC Negative Negative   Influenza B, POC Negative Negative    Assessment/Plan: Zivah Mayr is a 58 y.o. female present for OV for  Influenza-like illness - POC Influenza A&B(BINAX/QUICKVUE Flu test negative- opt to treat clinically believe it is a false negative- she had exposure, clinically appears influenza and control was  lighter than usual as well.  Rest, hydrate.  + flonase,  mucinex (DM if cough), nettie pot or nasal saline.  tamiflu prescribed, take until completed.  If  cough present it can last up to 6-8 weeks.  F/U 2 weeks of not improved.    Reviewed expectations re: course of current medical issues.  Discussed self-management of symptoms.  Outlined signs and symptoms indicating need for more acute intervention.  Patient verbalized understanding and all questions were answered.  Patient received an After-Visit Summary.    Orders Placed This Encounter  Procedures  . POC Influenza A&B(BINAX/QUICKVUE)   > 25 minutes spent with patient, >50% of time spent face to face     Note is dictated utilizing voice recognition software. Although note has been proof read prior to signing, occasional typographical errors still can be missed. If any questions arise, please do not hesitate to call for verification.   electronically signed by:  Howard Pouch, DO  [place]

## 2018-10-18 NOTE — Telephone Encounter (Signed)
OK to eRx tamiflu 75mg , 1 tab po qd x 10d, #10, no RF.--thx

## 2018-10-18 NOTE — Telephone Encounter (Signed)
Pt was seen by Dr. Raoul Pitch yesterday and Rx was sent.

## 2018-11-02 ENCOUNTER — Encounter: Payer: Self-pay | Admitting: Family Medicine

## 2018-11-02 DIAGNOSIS — G894 Chronic pain syndrome: Secondary | ICD-10-CM | POA: Diagnosis not present

## 2018-11-02 DIAGNOSIS — M542 Cervicalgia: Secondary | ICD-10-CM | POA: Diagnosis not present

## 2018-11-02 DIAGNOSIS — G90511 Complex regional pain syndrome I of right upper limb: Secondary | ICD-10-CM | POA: Diagnosis not present

## 2018-11-08 ENCOUNTER — Encounter: Payer: Self-pay | Admitting: Family Medicine

## 2018-11-30 ENCOUNTER — Other Ambulatory Visit: Payer: Self-pay | Admitting: Family Medicine

## 2018-12-15 ENCOUNTER — Encounter: Payer: Self-pay | Admitting: Family Medicine

## 2018-12-18 ENCOUNTER — Encounter: Payer: Self-pay | Admitting: Family Medicine

## 2018-12-19 DIAGNOSIS — J0191 Acute recurrent sinusitis, unspecified: Secondary | ICD-10-CM | POA: Diagnosis not present

## 2018-12-20 DIAGNOSIS — K591 Functional diarrhea: Secondary | ICD-10-CM | POA: Diagnosis not present

## 2018-12-20 DIAGNOSIS — R1906 Epigastric swelling, mass or lump: Secondary | ICD-10-CM | POA: Diagnosis not present

## 2018-12-20 DIAGNOSIS — R634 Abnormal weight loss: Secondary | ICD-10-CM | POA: Diagnosis not present

## 2018-12-30 ENCOUNTER — Telehealth: Payer: Self-pay

## 2018-12-30 ENCOUNTER — Encounter: Payer: Self-pay | Admitting: Family Medicine

## 2018-12-30 NOTE — Telephone Encounter (Signed)
Pls notify pharmacy: ok to RF the lorazepam early (today).-thx

## 2018-12-30 NOTE — Telephone Encounter (Signed)
SW [facility], given okay to fill early per PCP. Pt notified via Rosa Sanchez.

## 2018-12-30 NOTE — Telephone Encounter (Signed)
RF request for Lorazepam LOV: 08/01/18 Next ov: none Last written: 08/01/18 #60 w/ 5RF.  Confirmed last pick up was 12/07/18 and pt still has 1 refill left. Pt is requesting med to be filled early due to being out of town and husband can mail to her. Refill is not due until 4/29. She claims she has not had due to being lost in the mail. PMP aware printed.

## 2019-01-02 ENCOUNTER — Ambulatory Visit (INDEPENDENT_AMBULATORY_CARE_PROVIDER_SITE_OTHER): Payer: 59 | Admitting: Family Medicine

## 2019-01-02 ENCOUNTER — Other Ambulatory Visit: Payer: Self-pay

## 2019-01-02 ENCOUNTER — Encounter: Payer: Self-pay | Admitting: Family Medicine

## 2019-01-02 VITALS — Temp 98.3°F | Wt 172.8 lb

## 2019-01-02 DIAGNOSIS — F411 Generalized anxiety disorder: Secondary | ICD-10-CM | POA: Diagnosis not present

## 2019-01-02 DIAGNOSIS — Z8639 Personal history of other endocrine, nutritional and metabolic disease: Secondary | ICD-10-CM | POA: Diagnosis not present

## 2019-01-02 DIAGNOSIS — J301 Allergic rhinitis due to pollen: Secondary | ICD-10-CM | POA: Diagnosis not present

## 2019-01-02 DIAGNOSIS — J0191 Acute recurrent sinusitis, unspecified: Secondary | ICD-10-CM

## 2019-01-02 MED ORDER — AZITHROMYCIN 250 MG PO TABS: ORAL_TABLET | ORAL | 0 refills | Status: DC

## 2019-01-02 MED ORDER — DULOXETINE HCL 60 MG PO CPEP: 60.0000 mg | ORAL_CAPSULE | Freq: Every day | ORAL | 3 refills | Status: AC

## 2019-01-02 MED ORDER — LORAZEPAM 2 MG PO TABS: ORAL_TABLET | ORAL | 5 refills | Status: DC

## 2019-01-02 MED ORDER — PREDNISONE 10 MG PO TABS: ORAL_TABLET | ORAL | 0 refills | Status: DC

## 2019-01-02 NOTE — Telephone Encounter (Signed)
MyChart message read.

## 2019-01-02 NOTE — Progress Notes (Signed)
Virtual Visit via Video Note  I connected with Tracy Olsen on 01/02/19 at  3:20 PM EDT by a video enabled telemedicine application and verified that I am speaking with the correct person using two identifiers.  Location patient: at her home in [place], [place] (she also lives some in [place], [place]). Location provider:work or home office Persons participating in the virtual visit: patient, provider  I discussed the limitations of evaluation and management by telemedicine and the availability of in person appointments. The patient expressed understanding and agreed to proceed.   HPI: 58 y/o WF being seen for "med refill" as well as "sinus issues". Onset about 5 wks ago, nasal congestion/says she feels like her sinuses are full and she can't get anything out.  Pressure in both ears.  HA's in forehead and around eyes. Nasal rinses don't get much out.  No ST or cough.  No fevers. Went to UC not long after sx's started 1 mo ago and was given z pack and prednisone x 3d and got better.  For her GAD and hx of MDD I treat her with duloxetine 60mg  qd and lorazepam 2mg , 2 tabs po qhs. Controlled substance contract in chart dated 04/26/18.   She feels like her anxiety level is pretty stable. Has struggled with insomnia for the last 1 mo b/c she was w/out her ativan b/c rx was lost in mail per Tracy Olsen-->now back on this med the last couple days and is feeling improved after getting a couple of nights with much improved sleep.  She does have prediabetes, Hba1c 6% about 5-6 mo ago. She takes simvastatin for hyperlipidemia. All of her chronic medical problems are superceded by her chronic pain problems. Her pain is the focus of all of her complaints.  Unfortunately, she has not had much in the way of relief with any consistency, primarily b/c she is intolerant of any opioids. C spine surgery scheduled for 09/2018 got cancelled b/c she was not going to be able to be on any pain control treatment for post-surgical period.   She is  going to see an MD at the end of 01/2019 who is a specialist in treating CRPS, with the hopes of coming up with a plan for pain tx.  Iron def: from rectal bleeding from hemorrhoids.  She has not been able to tolerate any oral iron prep except for what comes in MVI.   Got IV iron infusion 07/2018 but had trouble tolerating this.  ROS: no CP, no SOB, no wheezing, no cough, no dizziness,no rashes, no melena/hematochezia.  No polyuria or polydipsia.   Past Medical History:  Diagnosis Date  . Adverse drug reaction    penicillins, oxycodone, keflex.  Can take 2nd and 3rd gen cephalo's and azith  . Arthritis   . Blood in stool    EGD and colonoscopy w/out source of bleeding 12/2017-->+ hemorrhoids-->GI suspects hemorrhoid dz as cause of blood in stool.  . Chronic pain syndrome    CRPS R shoulder and arm down to hand; cervical DDD/spondylosis, cervical myofascial pain->eval by [facility] 10/2018-->nothing to offer b/c Tracy Olsen allergic to all opioids and has tried all other modalities of pain mgmt  . Chronic post-traumatic stress disorder (PTSD)    + persistent complicated bereavement disorder (Dr. Doroteo Glassman, PhD, psychology).  . Complex regional pain syndrome    onset with broken R wrist; pain in both arms  . DDD (degenerative disc disease), cervical 06/2018   MRI: moderate to severe C6-7 neural foraminal  stenosis, L>R--->to neurosurgeon.  . Depression    anx and dep  . DOE (dyspnea on exertion) 09/05/2018   Stress echo 09/05/18 via [facility].-->NORMAL, EF 55% at rest and with exercise, no ischemia, no valvular abnormalities.  . Frequent headaches    migraine synd + tension HAs  . GAD (generalized anxiety disorder)   . Gastric polyp 12/2017   Hyperplastic->Dr. outlaw considering removal in hosp after severe GERD fully addressed.  Marland Kitchen GERD (gastroesophageal reflux disease)    w/dysphagia and nausea.  Dr. Paulita Fujita: failed multiple PPIs, EGD normal except small hiatal  hernia and 12 mm gastric polyp--48H esoph manometry planned as of 02/23/18.  [facility]-->FOURTH opinion 04/05/18: bid dexilant started.  Tracy Olsen imprvd at f/u 05/31/18  . History of cellulitis    with abscess or oral soft tissues  . Hyperlipidemia   . IBS (irritable bowel syndrome)    Diarrhea  . Idiopathic urticaria    Dr. Carmelina Peal  . Insomnia   . Insulin resistance   . Iron deficiency anemia 12/2017   11/2017.  Tracy Olsen to get EGD/Colonoscopy by Dr. Paulita Fujita 01/2018.  Tracy Olsen intolerant of oral iron-->plan for feraheme infusions as of 08/02/18.  . Nephrolithiasis    never had to have one extracted or lithotripsy  . Palpitations    PVCs: no medical therpay recommended by her cardiologist in [place], [place].  Marland Kitchen Recurrent UTI    approx 3 per year.  . Thyroid nodule 08/2018   Left lobe 1.8 cm nodule bx'd by [facility] 09/2018-->benign.  Also has left lobe 1.4 cm nodule that needs f/u ultrasound in 1 yr.  . Vitamin B12 deficiency    IF ab neg.  Intol B12 inj.  Started B12 oral 01/2018    Past Surgical History:  Procedure Laterality Date  . ABDOMINAL HYSTERECTOMY  age 33   endometriosis; also history of "severely abnormal pap"  . CARDIOVASCULAR STRESS TEST  09/05/2018   Poor sound transmission; definity IV used. Normal Stress ECHO. Trace TR. No prior study with exercise, no ischemic symptoms, ECG changes, wall motion abnormalities  . CESAREAN SECTION     X 2  . CHOLECYSTECTOMY  age 89  . COLONOSCOPY  2012   Normal  . COLONOSCOPY  01/18/2018   Normal: repeat 10 years  . ESOPHAGOGASTRODUODENOSCOPY  01/2018   Hyperplastic gastric polyp-->GI (Outlaw) considering polyp removal in hosp.  . LE venous doppler U/s  04/2011   No DVT  . NASAL SINUS SURGERY  age 25  . TONSILLECTOMY  age 76  . UMBILICAL HERNIA REPAIR  age 33    Family History  Problem Relation Age of Onset  . Arthritis Mother   . Rheum arthritis Mother   . Heart disease Mother   . Hypertension Mother   . Kidney disease Mother   .  Raynaud syndrome Mother   . Multiple sclerosis Mother   . Lupus Mother   . Prostate cancer Father   . Heart disease Maternal Uncle   . Hypertension Maternal Uncle   . Hyperlipidemia Maternal Uncle   . Arthritis Maternal Grandmother   . Rheum arthritis Maternal Grandmother   . Kidney disease Maternal Grandmother   . Breast cancer Maternal Grandmother   . Heart disease Maternal Grandfather   . Diabetes Maternal Grandfather   . Heart disease Paternal Grandmother   . Hyperlipidemia Paternal Grandmother   . Kidney disease Maternal Uncle   . Prostate cancer Maternal Uncle      Current Outpatient Medications:  .  dexlansoprazole (DEXILANT) 60 MG capsule, Take 1 capsule (60 mg total) by mouth daily. (Patient taking differently: Take 60 mg by mouth 2 (two) times daily. ), Disp: 90 capsule, Rfl: 1 .  DULoxetine (CYMBALTA) 60 MG capsule, Take 60 mg by mouth daily., Disp: , Rfl:  .  gabapentin (NEURONTIN) 600 MG tablet, TAKE ONE TABLET (600 MG TOTAL) BY MOUTH 3 (THREE) TIMES A DAY., Disp: , Rfl: 2 .  LORazepam (ATIVAN) 2 MG tablet, 2 tabs po qhs, Disp: 60 tablet, Rfl: 5 .  Multiple Vitamin (MULTIVITAMIN) tablet, Take 1 tablet by mouth daily., Disp: , Rfl:  .  pediatric multivitamin-iron (POLY-VI-SOL WITH IRON) 15 MG chewable tablet, Chew 1 tablet by mouth daily., Disp: , Rfl:  .  promethazine (PHENERGAN) 12.5 MG tablet, 1-2 tabs po q6h prn nausea, Disp: 30 tablet, Rfl: 3 .  simvastatin (ZOCOR) 20 MG tablet, TAKE 1 TABLET BY MOUTH EVERY DAY, Disp: 90 tablet, Rfl: 1 .  tiZANidine (ZANAFLEX) 4 MG tablet, Take 4 mg by mouth once. Muscle relaxant, Disp: , Rfl:   EXAM:  VITALS per patient if applicable: Temp 85.0 F (36.8 C) (Oral)   Wt 172 lb 12.8 oz (78.4 kg)   BMI 27.06 kg/m    GENERAL: alert, oriented, appears well and in no acute distress  HEENT: atraumatic, conjunttiva clear, no obvious abnormalities on inspection of external nose and ears  NECK: normal movements of the head and  neck  LUNGS: on inspection no signs of respiratory distress, breathing rate appears normal, no obvious gross SOB, gasping or wheezing  CV: no obvious cyanosis  MS: moves all visible extremities without noticeable abnormality  PSYCH/NEURO: pleasant and cooperative, no obvious depression or anxiety, speech and thought processing grossly intact  LABS: none today    Chemistry      Component Value Date/Time   NA 139 08/01/2018 1050   NA 137 03/15/2014   K 4.3 08/01/2018 1050   CL 103 08/01/2018 1050   CO2 27 08/01/2018 1050   BUN 12 08/01/2018 1050   BUN 13 03/15/2014   CREATININE 0.74 08/01/2018 1050      Component Value Date/Time   CALCIUM 9.8 08/01/2018 1050   ALKPHOS 81 08/01/2018 1050   AST 28 08/01/2018 1050   ALT 29 08/01/2018 1050   BILITOT 0.4 08/01/2018 1050     Lab Results  Component Value Date   WBC 5.1 08/01/2018   HGB 12.0 08/01/2018   HCT 37.4 08/01/2018   MCV 79.0 08/01/2018   PLT 386.0 08/01/2018   Lab Results  Component Value Date   IRON 64 04/26/2018   IRON 56 04/26/2018   TIBC 454 (H) 04/26/2018   FERRITIN 6.5 (L) 08/01/2018   Lab Results  Component Value Date   VITAMINB12 384 08/01/2018    Lab Results  Component Value Date   TSH 1.37 11/15/2017   Lab Results  Component Value Date   CHOL 220 (H) 08/01/2018   HDL 50.70 08/01/2018   LDLCALC 144 (H) 08/01/2018   TRIG 125.0 08/01/2018   CHOLHDL 4 08/01/2018   Lab Results  Component Value Date   HGBA1C 6.0 08/01/2018   ASSESSMENT AND PLAN:  Discussed the following assessment and plan:  1) GAD: The current medical regimen is effective;  continue present plan and medications. RF'd cymbalta today.  Also eRx'd lorazepam 2mg , 2 tabs po qhs, #60, RF x 5-->this rx is to be kept on file at pharmacy, with first fill to be on/after 01/28/2019.  2) Acute  sinusitis: z-pack and prednisone taper (30 qd x 3d, 20 qd x 3d, then 10 qd x 3d). Continue sinus rinses.  3) Iron def, w/out anemia  (believed to have come from recurrent rectal bleeding from hemorrhoids): she can't tolerate any oral iron beyond what is in MVI. Did not tolerate more than 1 iron infusion back in Nov 2019. We'll recheck cbc and iron panel at next f/u in 6 mo.  4) Prediabetes, hypercholesterolemia: she will continue statin and work on lower carb/lower fat diet. Recheck lipids and a1c at f/u 6 mo.  5) Chronic pain syndrome: CPRS as well as C spine dz: she will be getting another pain consult in about 1 mo with a CRPS specialist.  The goal is to get some sort of pain control plan so that she can undergo c spine surgery and have a plan for pain control in postoperative time period. Intol of all opioids.  She will continue gabapentin and zanaflex.  Future A1c, FLP, CBC with iron, CMET-->will get all of this at next CPE in 6 mo.  I discussed the assessment and treatment plan with the patient. The patient was provided an opportunity to ask questions and all were answered. The patient agreed with the plan and demonstrated an understanding of the instructions.   The patient was advised to call back or seek an in-person evaluation if the symptoms worsen or if the condition fails to improve as anticipated.  F/u: 6 mo cpe  Signed:  Crissie Sickles, MD           01/02/2019

## 2019-01-17 DIAGNOSIS — R0600 Dyspnea, unspecified: Secondary | ICD-10-CM | POA: Diagnosis not present

## 2019-01-17 DIAGNOSIS — R0609 Other forms of dyspnea: Secondary | ICD-10-CM | POA: Insufficient documentation

## 2019-01-20 DIAGNOSIS — R1906 Epigastric swelling, mass or lump: Secondary | ICD-10-CM | POA: Diagnosis not present

## 2019-01-24 ENCOUNTER — Encounter: Payer: Self-pay | Admitting: Family Medicine

## 2019-01-24 NOTE — Telephone Encounter (Signed)
OK to fill lorazepam Friday 01/27/19.

## 2019-04-19 ENCOUNTER — Encounter: Payer: Self-pay | Admitting: Family Medicine

## 2019-04-19 MED ORDER — PROMETHAZINE HCL 12.5 MG PO TABS: ORAL_TABLET | ORAL | 3 refills | Status: DC

## 2019-04-19 NOTE — Telephone Encounter (Signed)
Pt sent my chart message asking for refill on phenergan be sent to [facility], [address].  LOV: 01/02/2019 Next ov:  Not scheduled  Last written: 04/27/2018 #30 x3 refills   Please advise

## 2019-04-20 ENCOUNTER — Telehealth: Payer: Self-pay

## 2019-04-20 NOTE — Telephone Encounter (Signed)
Called patient and lvm that her rx has been sent to the pharm

## 2019-05-08 IMAGING — CR DG SINUSES COMPLETE 3+V
4 series · 4 of 4 positions shown · non-contrast
Comparison: No recent prior.

CLINICAL DATA: Sinus pain.

EXAM:
PARANASAL SINUSES - COMPLETE 3 + VIEW

[[person_name] pa]
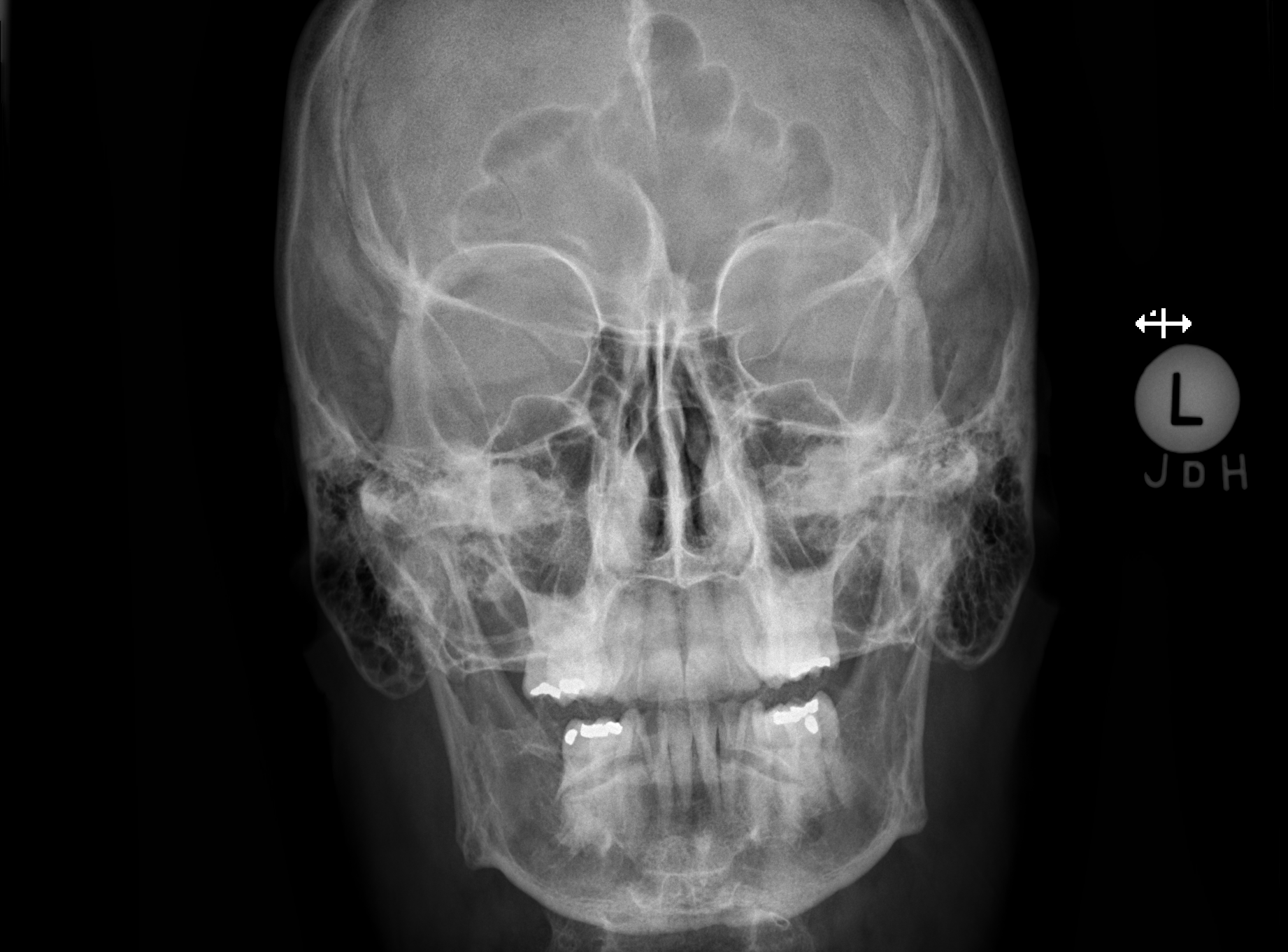

[w waters pa]
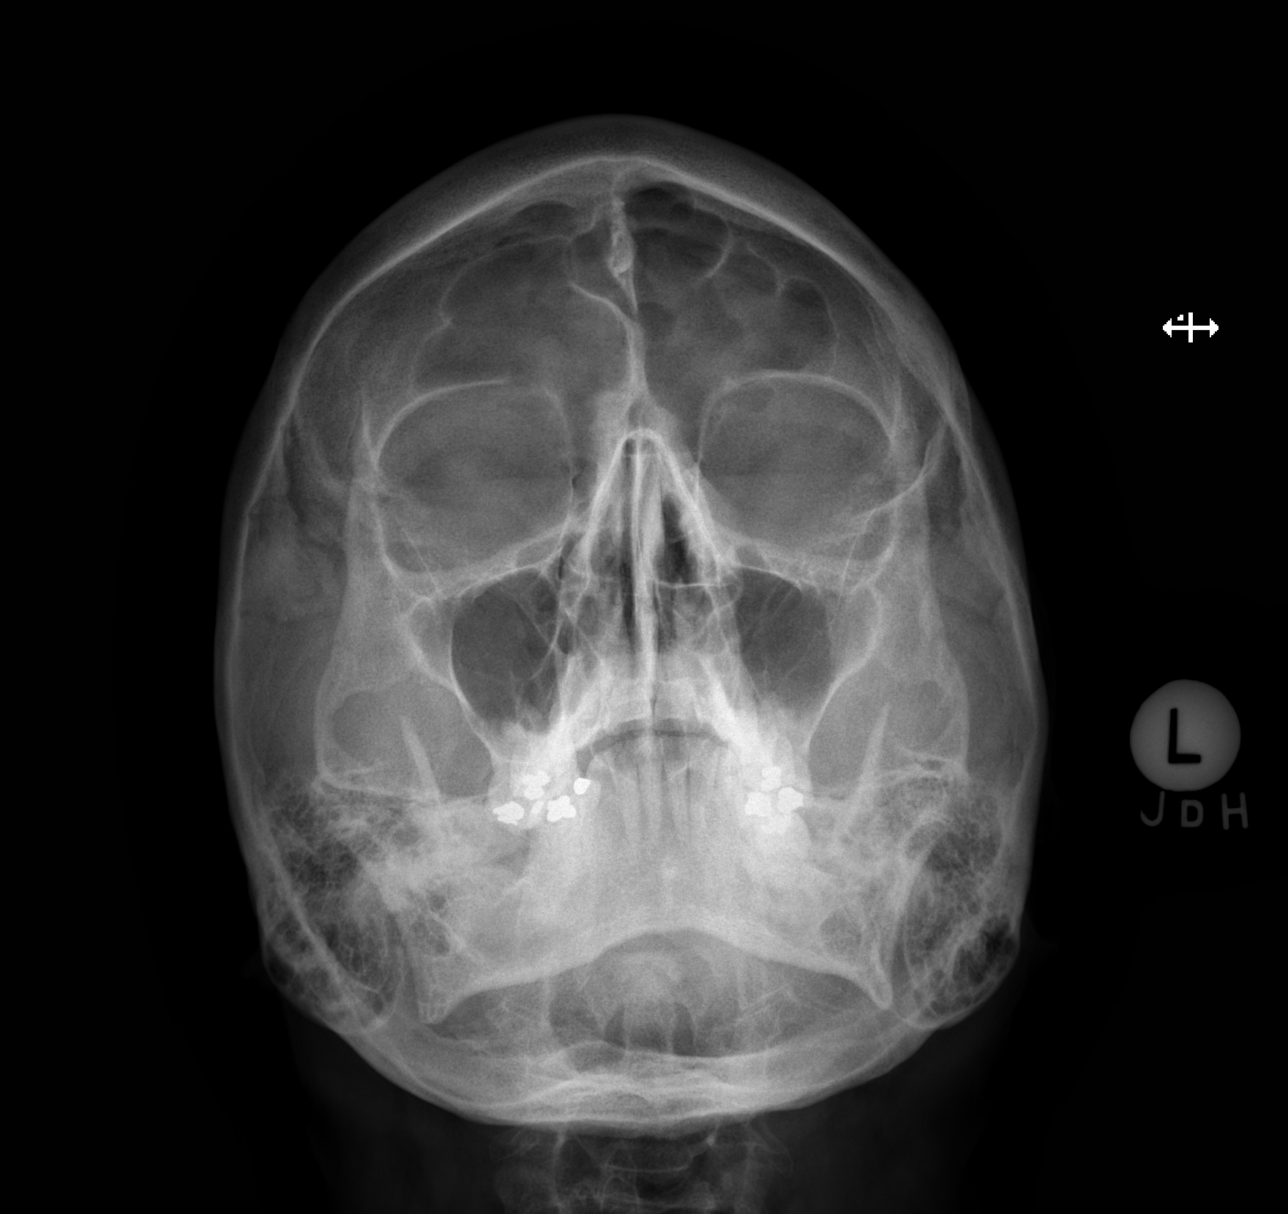

[w skull lat]
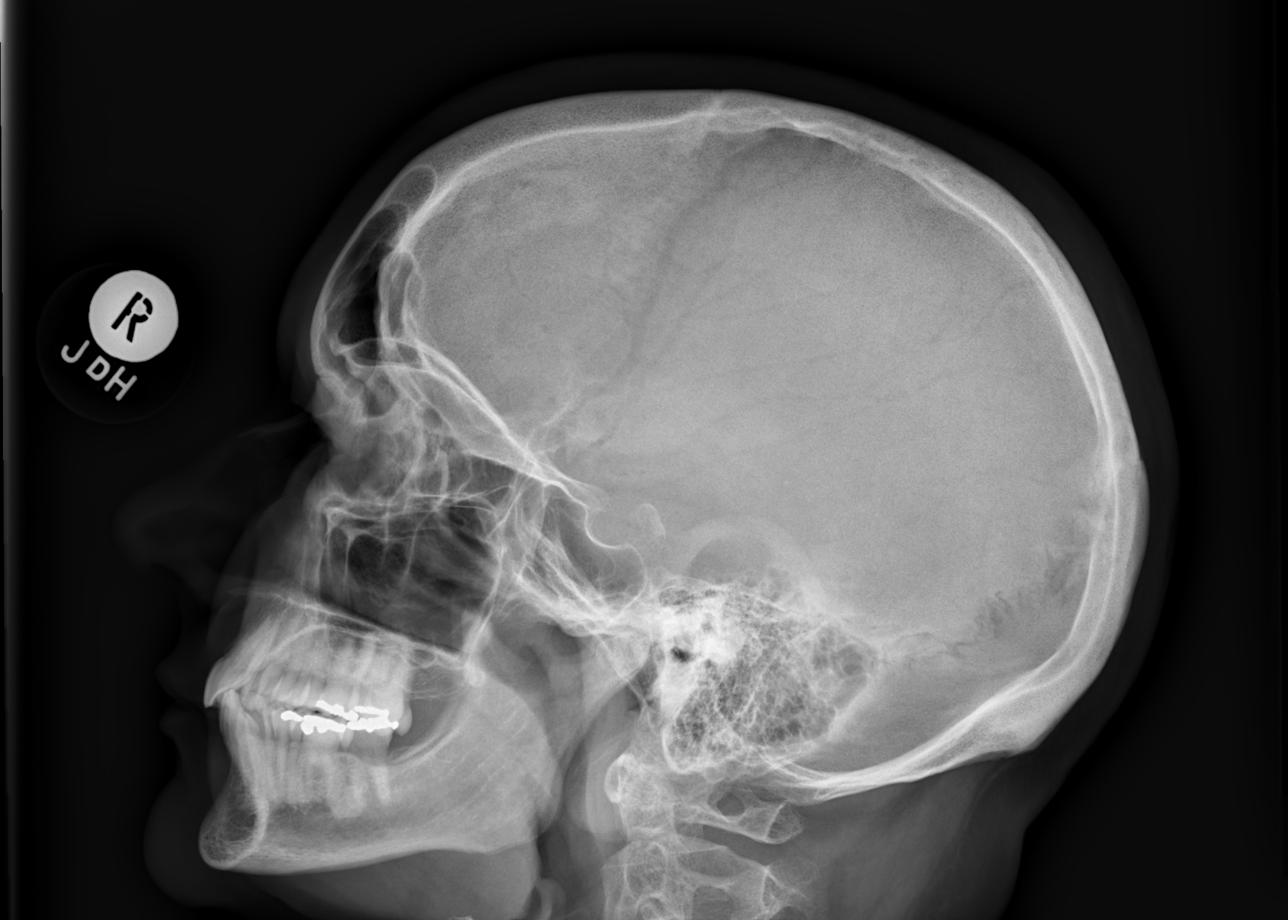

[w smv]
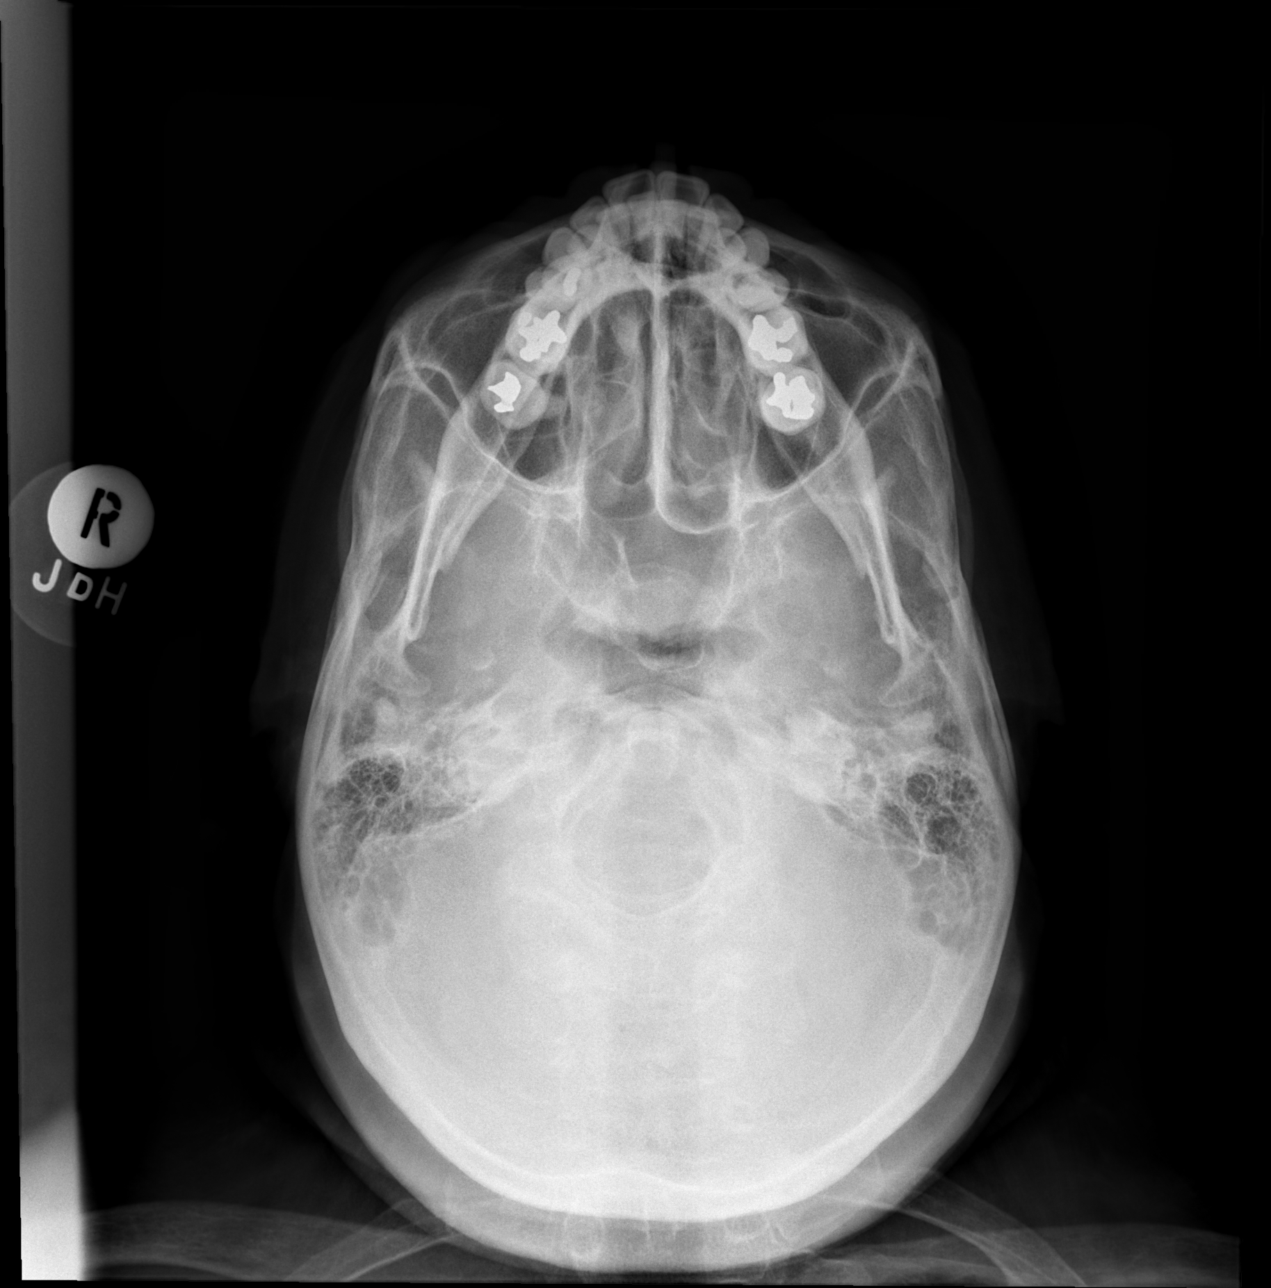

[4 of 4 positions shown; findings below may reference images not displayed]

FINDINGS: Paranasal sinuses are clear. No acute bony abnormality identified.
No evidence of fracture.
IMPRESSION: No acute abnormality.

## 2019-06-14 DIAGNOSIS — G43009 Migraine without aura, not intractable, without status migrainosus: Secondary | ICD-10-CM | POA: Insufficient documentation

## 2019-06-27 ENCOUNTER — Telehealth: Payer: Self-pay | Admitting: Family Medicine

## 2019-06-27 NOTE — Telephone Encounter (Signed)
Regular dose flu.s

## 2019-06-27 NOTE — Telephone Encounter (Signed)
Patient has scheduled a flu shot for 06/29/19. She is asking for the "middle dose". She states there are 3 strengths, she wants the middle one. Patient also states that the injection site will have to be in her hip, it cannot be in her arm.

## 2019-06-27 NOTE — Telephone Encounter (Signed)
Patient wanted to know if she needed to get the regular or high dose due to her immune system and having CRPS. She was advised okay to give in hip, still IM.   Please advise, thanks.

## 2019-06-28 NOTE — Telephone Encounter (Signed)
MyChart message read.

## 2019-06-28 NOTE — Telephone Encounter (Signed)
Left detailed message. Okay per DPR and MyChart sent as well.

## 2019-06-29 ENCOUNTER — Ambulatory Visit: Payer: 59

## 2019-07-11 ENCOUNTER — Encounter: Payer: Self-pay | Admitting: Family Medicine

## 2019-07-21 ENCOUNTER — Encounter: Payer: Self-pay | Admitting: Family Medicine

## 2019-07-21 MED ORDER — LORAZEPAM 2 MG PO TABS: ORAL_TABLET | ORAL | 5 refills | Status: DC

## 2019-07-21 NOTE — Telephone Encounter (Signed)
I'll eRx ativan now.

## 2019-07-21 NOTE — Telephone Encounter (Signed)
Patient is asking for a refill on her Ativan 2mg . I called the pharmacy and they stated that she picked up rx on 5/21, 6/21, 7/21, 8/22, 9/20, and 10/18. Patient states that she has 3 days left. Is she able to get a refill now? Please advise. [facility] will [facility]. Pharmacy is in chart

## 2019-07-25 ENCOUNTER — Other Ambulatory Visit: Payer: Self-pay

## 2019-07-26 ENCOUNTER — Encounter: Payer: Self-pay | Admitting: Family Medicine

## 2019-07-26 ENCOUNTER — Ambulatory Visit (INDEPENDENT_AMBULATORY_CARE_PROVIDER_SITE_OTHER): Payer: 59 | Admitting: Family Medicine

## 2019-07-26 VITALS — BP 130/87 | HR 100 | Temp 97.9°F | Resp 16 | Ht 67.0 in | Wt 195.6 lb

## 2019-07-26 DIAGNOSIS — Z124 Encounter for screening for malignant neoplasm of cervix: Secondary | ICD-10-CM

## 2019-07-26 DIAGNOSIS — R7303 Prediabetes: Secondary | ICD-10-CM | POA: Diagnosis not present

## 2019-07-26 DIAGNOSIS — E041 Nontoxic single thyroid nodule: Secondary | ICD-10-CM | POA: Diagnosis not present

## 2019-07-26 DIAGNOSIS — Z0000 Encounter for general adult medical examination without abnormal findings: Secondary | ICD-10-CM

## 2019-07-26 DIAGNOSIS — E7800 Pure hypercholesterolemia, unspecified: Secondary | ICD-10-CM | POA: Diagnosis not present

## 2019-07-26 DIAGNOSIS — Z1231 Encounter for screening mammogram for malignant neoplasm of breast: Secondary | ICD-10-CM

## 2019-07-26 NOTE — Progress Notes (Signed)
Office Note 07/26/2019  CC:  Chief Complaint  Patient presents with  . Annual Exam    pt is not fasting    HPI:  Tracy Olsen is a 58 y.o. White female who is here for annual health maintenance exam. GYN MD: none  She is not on B12 replacement: she had "neurologic reaction" to IM b12. She is taking only MVI qd.  Wants labs drawn today.  Not fasting.  Has some chicken and salad with a little mayonaise about 3 hours ago. SHe is not eating a good diet-"I have a sweet tooth". No exercise, none in years->has chronic pain syndrome that prevents much activity.  Has GAD, hx of MDD/PTSD: she has been fairly stable on cymbalta 60mg  qd and lorazepam 2mg  qhs. PMP AWARE reviewed. Most recent lorazepam rx filled 06/24/19, #60, rx by me.  No red flags.  Past Medical History:  Diagnosis Date  . Adverse drug reaction    penicillins, oxycodone, keflex.  Can take 2nd and 3rd gen cephalo's and azith  . Anxiety    after CRPS  . Blood in stool    EGD and colonoscopy w/out source of bleeding 12/2017-->+ hemorrhoids-->GI suspects hemorrhoid dz as cause of blood in stool.  . Chronic pain syndrome    CRPS R shoulder and arm down to hand; cervical DDD/spondylosis, cervical myofascial pain, chronic occip HAs->eval by [facility] 10/2018-->cerv epidural helped x 2wks.  Trigger point inj's helped.Propranolol for HAs.  . Chronic post-traumatic stress disorder (PTSD)    + persistent complicated bereavement disorder (Dr. Doroteo Glassman, PhD, psychology).  . Complex regional pain syndrome    onset with broken R wrist; pain in both arms  . DDD (degenerative disc disease), cervical 06/2018   MRI: moderate to severe C6-7 neural foraminal stenosis, L>R--->to neurosurgeon.  . Depression    anx and dep  . DOE (dyspnea on exertion) 09/05/2018   Stress echo 09/05/18 via [facility].-->NORMAL, EF 55% at rest and with exercise, no ischemia, no valvular abnormalities.  Marland Kitchen Dysphagia    . Frequent headaches    migraine synd + tension HAs  . GAD (generalized anxiety disorder)   . Gastric polyp 12/2017   Hyperplastic->Dr. outlaw considering removal in hosp after severe GERD fully addressed.  Marland Kitchen GERD (gastroesophageal reflux disease)    w/dysphagia and nausea.  Dr. Paulita Fujita: failed multiple PPIs, EGD normal except small hiatal hernia and 12 mm gastric polyp--48H esoph manometry planned as of 02/23/18.  [facility]-->FOURTH opinion 04/05/18: bid dexilant started.  Pt imprvd at f/u 05/31/18  . History of cellulitis    with abscess or oral soft tissues  . Hyperlipidemia   . IBS (irritable bowel syndrome)    Diarrhea  . Idiopathic urticaria    Dr. Carmelina Peal  . Insomnia   . Insulin resistance   . Iron deficiency anemia 12/2017   11/2017.  Pt to get EGD/Colonoscopy by Dr. Paulita Fujita 01/2018.  Pt intolerant of oral iron-->plan for feraheme infusions as of 08/02/18.  . Nephrolithiasis    never had to have one extracted or lithotripsy  . Osteoarthritis   . Palpitations    PVCs: no medical therpay recommended by her cardiologist in [place], [place].  Marland Kitchen Recurrent UTI    approx 3 per year.  . Thyroid nodule 08/2018   Left lobe 1.8 cm nodule bx'd by [facility] 09/2018-->benign.  Also has left lobe 1.4 cm nodule ->[facility] plans recheck 09/2019.  . Vitamin B12 deficiency    IF  ab neg.  Intol B12 inj.  Started B12 oral 01/2018    Past Surgical History:  Procedure Laterality Date  . ABDOMINAL HYSTERECTOMY  age 33   endometriosis; also history of "severely abnormal pap"  . CARDIOVASCULAR STRESS TEST  09/05/2018   Poor sound transmission; definity IV used. Normal Stress ECHO. Trace TR. No prior study with exercise, no ischemic symptoms, ECG changes, wall motion abnormalities  . CESAREAN SECTION     X 2  . CHOLECYSTECTOMY  age 65  . COLONOSCOPY  2012   Normal  . COLONOSCOPY  01/18/2018   Normal: repeat 10 years  . ESOPHAGOGASTRODUODENOSCOPY  01/2018   Hyperplastic gastric polyp-->GI  (Outlaw) considering polyp removal in hosp.  . LE venous doppler U/s  04/2011   No DVT  . NASAL SINUS SURGERY  age 89  . TONSILLECTOMY  age 38  . UMBILICAL HERNIA REPAIR  age 18    Family History  Problem Relation Age of Onset  . Arthritis Mother   . Rheum arthritis Mother   . Heart disease Mother   . Hypertension Mother   . Kidney disease Mother   . Raynaud syndrome Mother   . Multiple sclerosis Mother   . Lupus Mother   . Coronary artery disease Mother        blocked arteries around heart  . Headache Mother   . Prostate cancer Father   . Skin cancer Father   . Hypertension Father   . Diabetes type II Father   . ALS Father   . Suicidality Father        passed  . Heart disease Maternal Uncle   . Hypertension Maternal Uncle   . Hyperlipidemia Maternal Uncle   . Arthritis Maternal Uncle   . Kidney disease Maternal Uncle   . Arthritis Maternal Grandmother   . Rheum arthritis Maternal Grandmother   . Kidney disease Maternal Grandmother   . Breast cancer Maternal Grandmother   . Heart disease Maternal Grandfather   . Diabetes Maternal Grandfather   . Coronary artery disease Maternal Grandfather   . Hypertension Maternal Grandfather   . Heart disease Paternal Grandmother   . Hyperlipidemia Paternal Grandmother   . Alcohol abuse Paternal Grandmother   . Anxiety disorder Paternal Grandmother   . Coronary artery disease Paternal Grandmother   . Depression Paternal Grandmother   . Hypertension Paternal Grandmother   . Kidney disease Maternal Uncle   . Prostate cancer Maternal Uncle   . Alcohol abuse Paternal Grandfather   . Liver disease Paternal Grandfather     Social History   Socioeconomic History  . Marital status: Married    Spouse name: Not on file  . Number of children: Not on file  . Years of education: Not on file  . Highest education level: Not on file  Occupational History  . Not on file  Social Needs  . Financial resource strain: Not on file  . Food  insecurity    Worry: Not on file    Inability: Not on file  . Transportation needs    Medical: Not on file    Non-medical: Not on file  Tobacco Use  . Smoking status: Never Smoker  . Smokeless tobacco: Never Used  Substance and Sexual Activity  . Alcohol use: Yes    Comment: rarely  . Drug use: No  . Sexual activity: Not on file  Lifestyle  . Physical activity    Days per week: Not on file    Minutes per  session: Not on file  . Stress: Not on file  Relationships  . Social Herbalist on phone: Not on file    Gets together: Not on file    Attends religious service: Not on file    Active member of club or organization: Not on file    Attends meetings of clubs or organizations: Not on file    Relationship status: Not on file  . Intimate partner violence    Fear of current or ex partner: Not on file    Emotionally abused: Not on file    Physically abused: Not on file    Forced sexual activity: Not on file  Other Topics Concern  . Not on file  Social History Narrative   Married, 2 children (adults)   Educ: 4 yrs college.   Occ: housewife.   No tob/rarely alcohol.       Outpatient Medications Prior to Visit  Medication Sig Dispense Refill  . dexlansoprazole (DEXILANT) 60 MG capsule Take 1 capsule (60 mg total) by mouth daily. (Patient taking differently: Take 60 mg by mouth 2 (two) times daily. ) 90 capsule 1  . DULoxetine (CYMBALTA) 60 MG capsule Take 1 capsule (60 mg total) by mouth daily. 90 capsule 3  . gabapentin (NEURONTIN) 600 MG tablet TAKE ONE TABLET (600 MG TOTAL) BY MOUTH 3 (THREE) TIMES A DAY.  2  . LORazepam (ATIVAN) 2 MG tablet 2 tabs po qhs 60 tablet 5  . Multiple Vitamin (MULTIVITAMIN) tablet Take 1 tablet by mouth daily.    . promethazine (PHENERGAN) 12.5 MG tablet 1-2 tabs po q6h prn nausea 30 tablet 3  . propranolol (INDERAL) 10 MG tablet 2 (two) times daily.     . simvastatin (ZOCOR) 20 MG tablet TAKE 1 TABLET BY MOUTH EVERY DAY 90 tablet 1   . tiZANidine (ZANAFLEX) 4 MG tablet Take 4 mg by mouth once. Muscle relaxant    . azithromycin (ZITHROMAX) 250 MG tablet 2 tabs po qd x 1d, then 1 tab po qd x 4d (Patient not taking: Reported on 07/26/2019) 6 tablet 0  . pediatric multivitamin-iron (POLY-VI-SOL WITH IRON) 15 MG chewable tablet Chew 1 tablet by mouth daily.    . predniSONE (DELTASONE) 10 MG tablet 3 tabs po qd x 3d, then 2 tabs po qd x 3d, then 1 tab po qd x 3d (Patient not taking: Reported on 07/26/2019) 18 tablet 0  . propranolol (INDERAL) 20 MG tablet Take 20 mg by mouth 2 (two) times daily.     No facility-administered medications prior to visit.     Allergies  Allergen Reactions  . Codeine Anaphylaxis, Hives and Swelling  . Cyanocobalamin Shortness Of Breath, Palpitations and Other (See Comments)    Weak,nausea, vision disturbance  . Fentanyl Itching and Rash  . Meperidine Rash  . Nsaids Rash  . Propofol Other (See Comments)    "out of it all day" after endoscopy  . Propoxyphene Rash  . Cefdinir Itching    Itching + "hot" neck rash  . Lactase Diarrhea and Other (See Comments)    Stomach ache  . Erythromycin Hives and Swelling    Pt states that she can take zpak  . Penicillins Hives  . Percocet [Oxycodone-Acetaminophen] Other (See Comments)    unknown  . Pregabalin Other (See Comments)    suicidality  . Sulfa Antibiotics Hives and Swelling  . Tramadol Hives  . Vicodin [Hydrocodone-Acetaminophen] Other (See Comments)    unknown  . Cephalexin Other (  See Comments)    Abdominal pain    ROS Review of Systems  Constitutional: Positive for fatigue (chronic). Negative for appetite change, chills and fever.  HENT: Negative for congestion, dental problem, ear pain and sore throat.   Eyes: Negative for discharge, redness and visual disturbance.  Respiratory: Negative for cough, chest tightness, shortness of breath and wheezing.   Cardiovascular: Negative for chest pain, palpitations and leg swelling.   Gastrointestinal: Negative for abdominal pain, blood in stool, diarrhea, nausea and vomiting.  Endocrine: Negative for polydipsia, polyphagia and polyuria.  Genitourinary: Negative for difficulty urinating, dysuria, flank pain, frequency, hematuria and urgency.  Musculoskeletal: Positive for arthralgias (right shoulder and arm->CRPS) and neck pain (chronic). Negative for back pain, joint swelling, myalgias and neck stiffness.  Skin: Negative for pallor and rash.  Neurological: Negative for dizziness, speech difficulty, weakness and headaches.  Hematological: Negative for adenopathy. Does not bruise/bleed easily.  Psychiatric/Behavioral: Negative for confusion and sleep disturbance. The patient is nervous/anxious (chronic).     PE; Blood pressure 130/87, pulse 100, temperature 97.9 F (36.6 C), temperature source Temporal, resp. rate 16, height 5\' 7"  (1.702 m), weight 195 lb 9.6 oz (88.7 kg), SpO2 97 %. Body mass index is 30.64 kg/m. Exam chaperoned by Deveron Furlong, CMA.  Gen: Alert, well appearing.  Patient is oriented to person, place, time, and situation. AFFECT: pleasant, lucid thought and speech. ENT: Ears: EACs clear, normal epithelium.  TMs with good light reflex and landmarks bilaterally.  Eyes: no injection, icteris, swelling, or exudate.  EOMI, PERRLA. Nose: no drainage or turbinate edema/swelling.  No injection or focal lesion.  Mouth: lips without lesion/swelling.  Oral mucosa pink and moist.  Dentition intact and without obvious caries or gingival swelling.  Oropharynx without erythema, exudate, or swelling.  Neck: supple/nontender.  No LAD, mass, or TM.  Carotid pulses 2+ bilaterally, without bruits. CV: RRR, no m/r/g.   LUNGS: CTA bilat, nonlabored resps, good aeration in all lung fields. ABD: soft, NT, ND, BS normal.  No hepatospenomegaly or mass.  No bruits. EXT: no clubbing, cyanosis, or edema.  Musculoskeletal: no joint swelling, erythema, warmth, or tenderness.  ROM  of all joints intact. Skin - no sores or suspicious lesions or rashes or color changes   Pertinent labs:  Lab Results  Component Value Date   TSH 1.37 11/15/2017   Lab Results  Component Value Date   WBC 5.1 08/01/2018   HGB 12.0 08/01/2018   HCT 37.4 08/01/2018   MCV 79.0 08/01/2018   PLT 386.0 08/01/2018   Lab Results  Component Value Date   CREATININE 0.74 08/01/2018   BUN 12 08/01/2018   NA 139 08/01/2018   K 4.3 08/01/2018   CL 103 08/01/2018   CO2 27 08/01/2018   Lab Results  Component Value Date   ALT 29 08/01/2018   AST 28 08/01/2018   ALKPHOS 81 08/01/2018   BILITOT 0.4 08/01/2018   Lab Results  Component Value Date   CHOL 220 (H) 08/01/2018   Lab Results  Component Value Date   HDL 50.70 08/01/2018   Lab Results  Component Value Date   LDLCALC 144 (H) 08/01/2018   Lab Results  Component Value Date   TRIG 125.0 08/01/2018   Lab Results  Component Value Date   CHOLHDL 4 08/01/2018   Lab Results  Component Value Date   HGBA1C 6.0 08/01/2018    Lab Results  Component Value Date   VITAMINB12 384 08/01/2018    ASSESSMENT AND PLAN:  1) GAD, panic, hx MDD/PTSD: fairly stable. Continue cymbalta 60 mg qd and lorazepam 2mg  bid prn.  No new rx today.  2) Health maintenance exam: Reviewed age and gender appropriate health maintenance issues (prudent diet, regular exercise, health risks of tobacco and excessive alcohol, use of seatbelts, fire alarms in home, use of sunscreen).  Also reviewed age and gender appropriate health screening as well as vaccine recommendations. Vaccines:  All UTD.  Shingirx->will consider. Labs: ordered HP, Hba1c (prediab), and vit B12 (vit B12 def) Cervical ca screening: hx of hysterectomy at age 12 for endometriosis and hx of abnl paps->she states she had NO GYN f/u.  She has "too much going on" right now to pursue this. Breast ca screening: most recent mammogram 04/2016->she is scheduled to get this 10/2019. Colon ca  screening:  Repeat colonoscopy due 2029.  3) B12 def: has been on minimal oral replacement.  Recheck vit B12 level today.  An After Visit Summary was printed and given to the patient.  FOLLOW UP:  Return in about 6 months (around 01/23/2020) for routine chronic illness f/u.  Signed:  Crissie Sickles, MD           07/26/2019

## 2019-07-26 NOTE — Patient Instructions (Signed)
Health Maintenance, Female Adopting a healthy lifestyle and getting preventive care are important in promoting health and wellness. Ask your health care provider about:  The right schedule for you to have regular tests and exams.  Things you can do on your own to prevent diseases and keep yourself healthy. What should I know about diet, weight, and exercise? Eat a healthy diet   Eat a diet that includes plenty of vegetables, fruits, low-fat dairy products, and lean protein.  Do not eat a lot of foods that are high in solid fats, added sugars, or sodium. Maintain a healthy weight Body mass index (BMI) is used to identify weight problems. It estimates body fat based on height and weight. Your health care provider can help determine your BMI and help you achieve or maintain a healthy weight. Get regular exercise Get regular exercise. This is one of the most important things you can do for your health. Most adults should:  Exercise for at least 150 minutes each week. The exercise should increase your heart rate and make you sweat (moderate-intensity exercise).  Do strengthening exercises at least twice a week. This is in addition to the moderate-intensity exercise.  Spend less time sitting. Even light physical activity can be beneficial. Watch cholesterol and blood lipids Have your blood tested for lipids and cholesterol at 58 years of age, then have this test every 5 years. Have your cholesterol levels checked more often if:  Your lipid or cholesterol levels are high.  You are older than 58 years of age.  You are at high risk for heart disease. What should I know about cancer screening? Depending on your health history and family history, you may need to have cancer screening at various ages. This may include screening for:  Breast cancer.  Cervical cancer.  Colorectal cancer.  Skin cancer.  Lung cancer. What should I know about heart disease, diabetes, and high blood  pressure? Blood pressure and heart disease  High blood pressure causes heart disease and increases the risk of stroke. This is more likely to develop in people who have high blood pressure readings, are of African descent, or are overweight.  Have your blood pressure checked: ? Every 3-5 years if you are 18-39 years of age. ? Every year if you are 40 years old or older. Diabetes Have regular diabetes screenings. This checks your fasting blood sugar level. Have the screening done:  Once every three years after age 40 if you are at a normal weight and have a low risk for diabetes.  More often and at a younger age if you are overweight or have a high risk for diabetes. What should I know about preventing infection? Hepatitis B If you have a higher risk for hepatitis B, you should be screened for this virus. Talk with your health care provider to find out if you are at risk for hepatitis B infection. Hepatitis C Testing is recommended for:  Everyone born from 1945 through 1965.  Anyone with known risk factors for hepatitis C. Sexually transmitted infections (STIs)  Get screened for STIs, including gonorrhea and chlamydia, if: ? You are sexually active and are younger than 58 years of age. ? You are older than 58 years of age and your health care provider tells you that you are at risk for this type of infection. ? Your sexual activity has changed since you were last screened, and you are at increased risk for chlamydia or gonorrhea. Ask your health care provider if   you are at risk.  Ask your health care provider about whether you are at high risk for HIV. Your health care provider may recommend a prescription medicine to help prevent HIV infection. If you choose to take medicine to prevent HIV, you should first get tested for HIV. You should then be tested every 3 months for as long as you are taking the medicine. Pregnancy  If you are about to stop having your period (premenopausal) and  you may become pregnant, seek counseling before you get pregnant.  Take 400 to 800 micrograms (mcg) of folic acid every day if you become pregnant.  Ask for birth control (contraception) if you want to prevent pregnancy. Osteoporosis and menopause Osteoporosis is a disease in which the bones lose minerals and strength with aging. This can result in bone fractures. If you are 65 years old or older, or if you are at risk for osteoporosis and fractures, ask your health care provider if you should:  Be screened for bone loss.  Take a calcium or vitamin D supplement to lower your risk of fractures.  Be given hormone replacement therapy (HRT) to treat symptoms of menopause. Follow these instructions at home: Lifestyle  Do not use any products that contain nicotine or tobacco, such as cigarettes, e-cigarettes, and chewing tobacco. If you need help quitting, ask your health care provider.  Do not use street drugs.  Do not share needles.  Ask your health care provider for help if you need support or information about quitting drugs. Alcohol use  Do not drink alcohol if: ? Your health care provider tells you not to drink. ? You are pregnant, may be pregnant, or are planning to become pregnant.  If you drink alcohol: ? Limit how much you use to 0-1 drink a day. ? Limit intake if you are breastfeeding.  Be aware of how much alcohol is in your drink. In the [place] one drink equals one 12 oz bottle of beer (355 mL), one 5 oz glass of wine (148 mL), or one 1 oz glass of hard liquor (44 mL). General instructions  Schedule regular health, dental, and eye exams.  Stay current with your vaccines.  Tell your health care provider if: ? You often feel depressed. ? You have ever been abused or do not feel safe at home. Summary  Adopting a healthy lifestyle and getting preventive care are important in promoting health and wellness.  Follow your health care provider's instructions about healthy  diet, exercising, and getting tested or screened for diseases.  Follow your health care provider's instructions on monitoring your cholesterol and blood pressure. This information is not intended to replace advice given to you by your health care provider. Make sure you discuss any questions you have with your health care provider. Document Released: 03/09/2011 Document Revised: 08/17/2018 Document Reviewed: 08/17/2018 Elsevier Patient Education  2020 [employer]  

## 2019-07-27 LAB — CBC WITH DIFFERENTIAL/PLATELET
Basophils Absolute: 0.1 10*3/uL (ref 0.0–0.1)
Basophils Relative: 1.3 % (ref 0.0–3.0)
Eosinophils Absolute: 0.4 10*3/uL (ref 0.0–0.7)
Eosinophils Relative: 5 % (ref 0.0–5.0)
HCT: 44.3 % (ref 36.0–46.0)
Hemoglobin: 14.8 g/dL (ref 12.0–15.0)
Lymphocytes Relative: 31 % (ref 12.0–46.0)
Lymphs Abs: 2.3 10*3/uL (ref 0.7–4.0)
MCHC: 33.5 g/dL (ref 30.0–36.0)
MCV: 92.3 fl (ref 78.0–100.0)
Monocytes Absolute: 0.6 10*3/uL (ref 0.1–1.0)
Monocytes Relative: 8.1 % (ref 3.0–12.0)
Neutro Abs: 4 10*3/uL (ref 1.4–7.7)
Neutrophils Relative %: 54.6 % (ref 43.0–77.0)
Platelets: 347 10*3/uL (ref 150.0–400.0)
RBC: 4.8 Mil/uL (ref 3.87–5.11)
RDW: 12.9 % (ref 11.5–15.5)
WBC: 7.4 10*3/uL (ref 4.0–10.5)

## 2019-07-27 LAB — COMPREHENSIVE METABOLIC PANEL
ALT: 64 U/L — ABNORMAL HIGH (ref 0–35)
AST: 42 U/L — ABNORMAL HIGH (ref 0–37)
Albumin: 4.9 g/dL (ref 3.5–5.2)
Alkaline Phosphatase: 83 U/L (ref 39–117)
BUN: 12 mg/dL (ref 6–23)
CO2: 26 mEq/L (ref 19–32)
Calcium: 10.2 mg/dL (ref 8.4–10.5)
Chloride: 102 mEq/L (ref 96–112)
Creatinine, Ser: 0.73 mg/dL (ref 0.40–1.20)
GFR: 81.76 mL/min (ref >60.00)
Glucose, Bld: 99 mg/dL (ref 70–99)
Potassium: 4.4 mEq/L (ref 3.5–5.1)
Sodium: 138 mEq/L (ref 135–145)
Total Bilirubin: 0.4 mg/dL (ref 0.2–1.2)
Total Protein: 7.2 g/dL (ref 6.0–8.3)

## 2019-07-27 LAB — LIPID PANEL
Cholesterol: 256 mg/dL — ABNORMAL HIGH (ref 0–200)
HDL: 56.6 mg/dL (ref >39.00)
LDL Cholesterol: 169 mg/dL — ABNORMAL HIGH (ref 0–99)
NonHDL: 199.77
Total CHOL/HDL Ratio: 5
Triglycerides: 153 mg/dL — ABNORMAL HIGH (ref 0.0–149.0)
VLDL: 30.6 mg/dL (ref 0.0–40.0)

## 2019-07-27 LAB — TSH: TSH: 0.97 u[IU]/mL (ref 0.35–4.50)

## 2019-07-27 LAB — HEMOGLOBIN A1C: Hgb A1c MFr Bld: 5.7 % (ref 4.6–6.5)

## 2019-07-28 ENCOUNTER — Other Ambulatory Visit: Payer: Self-pay

## 2019-07-28 ENCOUNTER — Encounter: Payer: Self-pay | Admitting: Family Medicine

## 2019-07-28 ENCOUNTER — Other Ambulatory Visit: Payer: 59

## 2019-07-28 DIAGNOSIS — E538 Deficiency of other specified B group vitamins: Secondary | ICD-10-CM

## 2019-07-28 NOTE — Telephone Encounter (Signed)
Called patient and advised that it is a personal choice if she would like to change her thanksgiving plans. Advised patient that Dr. Anitra Lauth will be out of town until Tuesday. There is no one else that can read the labs unless they are critical. Patient understood

## 2019-07-29 LAB — VITAMIN B12: Vitamin B-12: 491 pg/mL (ref 200–1100)

## 2019-08-01 ENCOUNTER — Encounter: Payer: Self-pay | Admitting: Family Medicine

## 2019-08-02 ENCOUNTER — Other Ambulatory Visit: Payer: Self-pay

## 2019-08-02 ENCOUNTER — Telehealth: Payer: Self-pay

## 2019-08-02 DIAGNOSIS — E7800 Pure hypercholesterolemia, unspecified: Secondary | ICD-10-CM

## 2019-08-02 DIAGNOSIS — R7401 Elevation of levels of liver transaminase levels: Secondary | ICD-10-CM

## 2019-08-02 NOTE — Telephone Encounter (Signed)
Pt has asked several times about results for CPE labs done on 11/18. She was told PCP would not be back in the office until Tuesday to review labs. Please review pt's labs.

## 2019-08-07 NOTE — Telephone Encounter (Signed)
Pt's labs were reviewed 11/25 and results given via [place].

## 2019-08-20 ENCOUNTER — Encounter: Payer: Self-pay | Admitting: Family Medicine

## 2019-08-21 NOTE — Telephone Encounter (Signed)
Pt has been scheduled for virtual visit today.

## 2019-08-21 NOTE — Telephone Encounter (Signed)
Pt has been scheduled for virtual visit  

## 2019-09-03 ENCOUNTER — Encounter: Payer: Self-pay | Admitting: Family Medicine

## 2019-09-15 ENCOUNTER — Encounter: Payer: Self-pay | Admitting: Family Medicine

## 2019-09-25 ENCOUNTER — Other Ambulatory Visit: Payer: Self-pay

## 2019-09-25 MED ORDER — SIMVASTATIN 20 MG PO TABS: 20.0000 mg | ORAL_TABLET | Freq: Every day | ORAL | 0 refills | Status: DC

## 2019-10-02 ENCOUNTER — Ambulatory Visit (INDEPENDENT_AMBULATORY_CARE_PROVIDER_SITE_OTHER): Payer: 59 | Admitting: Family Medicine

## 2019-10-02 ENCOUNTER — Other Ambulatory Visit: Payer: Self-pay

## 2019-10-02 DIAGNOSIS — R7401 Elevation of levels of liver transaminase levels: Secondary | ICD-10-CM

## 2019-10-02 DIAGNOSIS — E7800 Pure hypercholesterolemia, unspecified: Secondary | ICD-10-CM | POA: Diagnosis not present

## 2019-10-02 LAB — LIPID PANEL
Cholesterol: 209 mg/dL — ABNORMAL HIGH (ref 0–200)
HDL: 52.3 mg/dL (ref >39.00)
LDL Cholesterol: 138 mg/dL — ABNORMAL HIGH (ref 0–99)
NonHDL: 156.89
Total CHOL/HDL Ratio: 4
Triglycerides: 94 mg/dL (ref 0.0–149.0)
VLDL: 18.8 mg/dL (ref 0.0–40.0)

## 2019-10-02 LAB — AST: AST: 24 U/L (ref 0–37)

## 2019-10-02 LAB — ALT: ALT: 30 U/L (ref 0–35)

## 2019-10-20 LAB — NOVEL CORONAVIRUS, NAA: SARS-CoV-2, NAA: NEGATIVE

## 2019-10-22 ENCOUNTER — Other Ambulatory Visit: Payer: Self-pay | Admitting: Family Medicine

## 2019-10-23 ENCOUNTER — Encounter: Payer: Self-pay | Admitting: Family Medicine

## 2019-12-08 HISTORY — PX: US THYROID (ARMC HX): HXRAD1133

## 2019-12-11 ENCOUNTER — Encounter: Payer: Self-pay | Admitting: Family Medicine

## 2019-12-23 ENCOUNTER — Encounter: Payer: Self-pay | Admitting: Family Medicine

## 2020-01-01 LAB — HM MAMMOGRAPHY

## 2020-01-03 ENCOUNTER — Encounter: Payer: Self-pay | Admitting: Family Medicine

## 2020-01-10 DIAGNOSIS — M25511 Pain in right shoulder: Secondary | ICD-10-CM | POA: Insufficient documentation

## 2020-01-10 DIAGNOSIS — M546 Pain in thoracic spine: Secondary | ICD-10-CM | POA: Insufficient documentation

## 2020-01-15 ENCOUNTER — Other Ambulatory Visit: Payer: Self-pay | Admitting: Family Medicine

## 2020-01-15 NOTE — Telephone Encounter (Signed)
Pt should have enough until appt on 5/13

## 2020-01-17 ENCOUNTER — Encounter: Payer: Self-pay | Admitting: Family Medicine

## 2020-01-17 ENCOUNTER — Ambulatory Visit: Payer: 59 | Admitting: Family Medicine

## 2020-01-18 ENCOUNTER — Telehealth (INDEPENDENT_AMBULATORY_CARE_PROVIDER_SITE_OTHER): Payer: 59 | Admitting: Family Medicine

## 2020-01-18 ENCOUNTER — Other Ambulatory Visit: Payer: Self-pay

## 2020-01-18 ENCOUNTER — Encounter: Payer: Self-pay | Admitting: Family Medicine

## 2020-01-18 VITALS — BP 121/78 | HR 81 | Temp 98.4°F | Ht 67.0 in | Wt 190.2 lb

## 2020-01-18 DIAGNOSIS — G894 Chronic pain syndrome: Secondary | ICD-10-CM

## 2020-01-18 DIAGNOSIS — R7303 Prediabetes: Secondary | ICD-10-CM

## 2020-01-18 DIAGNOSIS — E7800 Pure hypercholesterolemia, unspecified: Secondary | ICD-10-CM | POA: Diagnosis not present

## 2020-01-18 DIAGNOSIS — F411 Generalized anxiety disorder: Secondary | ICD-10-CM

## 2020-01-18 MED ORDER — SIMVASTATIN 20 MG PO TABS: 20.0000 mg | ORAL_TABLET | Freq: Every day | ORAL | 0 refills | Status: DC

## 2020-01-18 MED ORDER — LORAZEPAM 2 MG PO TABS: ORAL_TABLET | ORAL | 5 refills | Status: DC

## 2020-01-18 NOTE — Progress Notes (Signed)
Virtual Visit via Video Note  I connected with pt on 01/18/20 at  2:30 PM EDT by a video enabled telemedicine application and verified that I am speaking with the correct person using two identifiers.  Location patient: home Location provider:work or home office Persons participating in the virtual visit: patient, provider  I discussed the limitations of evaluation and management by telemedicine and the availability of in person appointments. The patient expressed understanding and agreed to proceed.  Telemedicine visit is a necessity given the COVID-19 restrictions in place at the current time.  HPI: 59 y/o WF being seen today for 6 mo f/u HLD, prediabetes, anc chronic anxiety d/o.  HLD: las visit her LDL was up over her goal and I rec'd change to more potent statin but due to hx of intol we decided to keep her on simva-->recheck of lipids 2 mo later showed LDL back down to her baseline/goal and AST/ALT normal.  Prediab: no exercise b/c too busy with life stressors lately.  Anxiety d/o: has been maintained long term on cymbalta and lorazepam w/out adverse side effect. Anxiety higher lately, recent anniv of her father's suicide, mothers day inc stress, daughter's wedding recently, husband with health issues recently.  She is taking her lorazepam bid, cymbalta 60mg  qd.  Her chronic neck pain is still bad.  Says she is getting MRI neck and head soon. Weakness in arms limiting activity as well.  PMP AWARE reviewed today: most recent rx for lorazepam was filled 12/25/19, # 5, rx by me. No red flags.  ROS: See pertinent positives and negatives per HPI.  Past Medical History:  Diagnosis Date  . Adverse drug reaction    penicillins, oxycodone, keflex.  Can take 2nd and 3rd gen cephalo's and azith  . Anxiety    after CRPS  . Blood in stool    EGD and colonoscopy w/out source of bleeding 12/2017-->+ hemorrhoids-->GI suspects hemorrhoid dz as cause of blood in stool.  . Chronic pain  syndrome    CRPS R shoulder and arm down to hand; cervical DDD/spondylosis, cervical myofascial pain, chronic occip HAs->eval by [facility] 10/2018-->cerv epidural helped x 2wks.  Trigger point inj's helped.Propranolol for HAs no help.  . Chronic post-traumatic stress disorder (PTSD)    + persistent complicated bereavement disorder (Dr. Doroteo Glassman, PhD, psychology).  . Complex regional pain syndrome    onset with broken R wrist; pain in both arms  . DDD (degenerative disc disease), cervical 06/2018   MRI: moderate to severe C6-7 neural foraminal stenosis, L>R--->to neurosurgeon.  . Depression    anx and dep  . DOE (dyspnea on exertion) 09/05/2018   Stress echo 09/05/18 via [facility].-->NORMAL, EF 55% at rest and with exercise, no ischemia, no valvular abnormalities.  Marland Kitchen Dysphagia   . Frequent headaches    migraine synd + tension HAs  . GAD (generalized anxiety disorder)   . Gastric polyp 12/2017   Hyperplastic->Dr. outlaw considering removal in hosp after severe GERD fully addressed.  Marland Kitchen GERD (gastroesophageal reflux disease)    w/dysphagia and nausea.  Dr. Paulita Fujita: failed multiple PPIs, EGD normal except small hiatal hernia and 12 mm gastric polyp--48H esoph manometry planned as of 02/23/18.  [facility]-->FOURTH opinion 04/05/18: bid dexilant started.  Pt imprvd at f/u 05/31/18  . History of cellulitis    with abscess or oral soft tissues  . Hyperlipidemia   . IBS (irritable bowel syndrome)    Diarrhea  . Idiopathic urticaria  Dr. Carmelina Peal  . Insomnia   . Insulin resistance   . Iron deficiency anemia 12/2017   11/2017.  Pt to get EGD/Colonoscopy by Dr. Paulita Fujita 01/2018.  Pt intolerant of oral iron-->plan for feraheme infusions as of 08/02/18.  . Nephrolithiasis    never had to have one extracted or lithotripsy  . Osteoarthritis   . Palpitations    PVCs: no medical therpay recommended by her cardiologist in [place], [place].  Marland Kitchen Recurrent UTI    approx 3 per  year.  . Thyroid nodule 08/2018   Left lobe 1.8 cm nodule bx'd by [facility] 09/2018-->benign.  Also has left lobe 1.4 cm nodule ->all stabl on u/s 12/2019->f/u w/endo prn.  . Vitamin B12 deficiency    IF ab neg.  Intol B12 inj.  Started B12 oral 01/2018    Past Surgical History:  Procedure Laterality Date  . ABDOMINAL HYSTERECTOMY  age 58   endometriosis; also history of "severely abnormal pap"  . CARDIOVASCULAR STRESS TEST  09/05/2018   Poor sound transmission; definity IV used. Normal Stress ECHO. Trace TR. No prior study with exercise, no ischemic symptoms, ECG changes, wall motion abnormalities  . CESAREAN SECTION     X 2  . CHOLECYSTECTOMY  age 53  . COLONOSCOPY  2012   Normal  . COLONOSCOPY  01/18/2018   Normal: repeat 10 years  . ESOPHAGOGASTRODUODENOSCOPY  01/2018   Hyperplastic gastric polyp-->GI ([facility]) considering polyp removal in hosp.  . LE venous doppler U/s  04/2011   No DVT  . NASAL SINUS SURGERY  age 40  . TONSILLECTOMY  age 10  . UMBILICAL HERNIA REPAIR  age 80  . US THYROID ([place] [facility])  12/08/2019   stable appearance on left thyroid nodule #1    Family History  Problem Relation Age of Onset  . Arthritis Mother   . Rheum arthritis Mother   . Heart disease Mother   . Hypertension Mother   . Kidney disease Mother   . Raynaud syndrome Mother   . Multiple sclerosis Mother   . Lupus Mother   . Coronary artery disease Mother        blocked arteries around heart  . Headache Mother   . Prostate cancer Father   . Skin cancer Father   . Hypertension Father   . Diabetes type II Father   . ALS Father   . Suicidality Father        passed  . Heart disease Maternal Uncle   . Hypertension Maternal Uncle   . Hyperlipidemia Maternal Uncle   . Arthritis Maternal Uncle   . Kidney disease Maternal Uncle   . Arthritis Maternal Grandmother   . Rheum arthritis Maternal Grandmother   . Kidney disease Maternal Grandmother   . Breast cancer Maternal  Grandmother   . Heart disease Maternal Grandfather   . Diabetes Maternal Grandfather   . Coronary artery disease Maternal Grandfather   . Hypertension Maternal Grandfather   . Heart disease Paternal Grandmother   . Hyperlipidemia Paternal Grandmother   . Alcohol abuse Paternal Grandmother   . Anxiety disorder Paternal Grandmother   . Coronary artery disease Paternal Grandmother   . Depression Paternal Grandmother   . Hypertension Paternal Grandmother   . Kidney disease Maternal Uncle   . Prostate cancer Maternal Uncle   . Alcohol abuse Paternal Grandfather   . 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   Current Outpatient  Medications:  .  dexlansoprazole (DEXILANT) 60 MG capsule, Take 1 capsule (60 mg total) by mouth daily. (Patient taking differently: Take 60 mg by mouth 2 (two) times daily. ), Disp: 90 capsule, Rfl: 1 .  DULoxetine (CYMBALTA) 60 MG capsule, Take 1 capsule (60 mg total) by mouth daily., Disp: 90 capsule, Rfl: 3 .  gabapentin (NEURONTIN) 600 MG tablet, TAKE ONE TABLET (600 MG TOTAL) BY MOUTH 3 (THREE) TIMES A DAY., Disp: , Rfl: 2 .  LORazepam (ATIVAN) 2 MG tablet, 2 tabs po qhs, Disp: 60 tablet, Rfl: 5 .  Multiple Vitamin (MULTIVITAMIN) tablet, Take 1 tablet by mouth daily., Disp: , Rfl:  .  promethazine (PHENERGAN) 12.5 MG tablet, 1-2 tabs po q6h prn nausea, Disp: 30 tablet, Rfl: 3 .  simvastatin (ZOCOR) 20 MG tablet, TAKE 1 TABLET BY MOUTH EVERY DAY, Disp: 90 tablet, Rfl: 0 .  tiZANidine (ZANAFLEX) 4 MG tablet, Take 4 mg by mouth once. Muscle relaxant, Disp: , Rfl:  .  rizatriptan (MAXALT) 5 MG tablet, , Disp: , Rfl:   EXAM:  VITALS per patient if applicable:  Vitals with BMI 01/18/2020 07/26/2019 01/02/2019  Height 5\' 7"  5\' 7"  -  Weight 190 lbs 3 oz 195 lbs 10 oz 172 lbs 13 oz  BMI 0000000 [id number] -  Systolic [id number] -  Diastolic 78 87 -  Pulse 81 100 -      GENERAL: alert, oriented,  appears well and in no acute distress  HEENT: atraumatic, conjunttiva clear, no obvious abnormalities on inspection of external nose and ears  NECK: normal movements of the head and neck  LUNGS: on inspection no signs of respiratory distress, breathing rate appears normal, no obvious gross SOB, gasping or wheezing  CV: no obvious cyanosis  MS: moves all visible extremities without noticeable abnormality  PSYCH/NEURO: pleasant and cooperative, no obvious depression or anxiety, speech and thought processing grossly intact  LABS: none today  Lab Results  Component Value Date   VITAMINB12 491 07/28/2019      Chemistry      Component Value Date/Time   NA 138 07/26/2019 1505   NA 137 03/15/2014 0000   K 4.4 07/26/2019 1505   CL 102 07/26/2019 1505   CO2 26 07/26/2019 1505   BUN 12 07/26/2019 1505   BUN 13 03/15/2014 0000   CREATININE 0.73 07/26/2019 1505      Component Value Date/Time   CALCIUM 10.2 07/26/2019 1505   ALKPHOS 83 07/26/2019 1505   AST 24 10/02/2019 0854   ALT 30 10/02/2019 0854   BILITOT 0.4 07/26/2019 1505     Lab Results  Component Value Date   WBC 7.4 07/26/2019   HGB 14.8 07/26/2019   HCT 44.3 07/26/2019   MCV 92.3 07/26/2019   PLT 347.0 07/26/2019   Lab Results  Component Value Date   CHOL 209 (H) 10/02/2019   HDL 52.30 10/02/2019   LDLCALC 138 (H) 10/02/2019   TRIG 94.0 10/02/2019   CHOLHDL 4 10/02/2019   Lab Results  Component Value Date   TSH 0.97 07/26/2019   Lab Results  Component Value Date   HGBA1C 5.7 07/26/2019   ASSESSMENT AND PLAN:  Discussed the following assessment and plan:  1) GAD, recurrent MDD: Mood is stable but anxiety high lately, life stressors at a max lately. Emotional support/encouragement given.  No change in meds. RF'd lorazepam today.  2) HLD: tolerating simvastatin.  Last lipids reasonable 09/2019. Plan repeat lipids 86mo.  3) Prediabetes: diet and exercise  improvements reiterated. Hba1c at her  earliest convenience.  4) Chronic pain: c-spine.  This is debilitating, particularly since she cannot tolerate any opioids at all. Upcoming MRI neck and head by her specialists at [facility].  -we discussed possible serious and likely etiologies, options for evaluation and workup, limitations of telemedicine visit vs in person visit, treatment, treatment risks and precautions. Pt prefers to treat via telemedicine empirically rather then risking or undertaking an in person visit at this moment. Patient agrees to seek prompt in person care if worsening, new symptoms arise, or if is not improving with treatment.   I discussed the assessment and treatment plan with the patient. The patient was provided an opportunity to ask questions and all were answered. The patient agreed with the plan and demonstrated an understanding of the instructions.   The patient was advised to call back or seek an in-person evaluation if the symptoms worsen or if the condition fails to improve as anticipated.  F/u: 6 mo cpe; also lab appt at her earliest convenience for cmet and a1c.  Signed:  Crissie Sickles, MD           01/18/2020

## 2020-01-24 ENCOUNTER — Ambulatory Visit: Payer: 59 | Admitting: Family Medicine

## 2020-02-01 ENCOUNTER — Encounter: Payer: Self-pay | Admitting: Family Medicine

## 2020-02-01 NOTE — Telephone Encounter (Signed)
I don't have access to the MRI images or report.  I need the report (pls get it for me) and then I can see her and we can discuss. No meds at this time.

## 2020-02-01 NOTE — Telephone Encounter (Signed)
Pt was advised she would need appt/VV to discuss symptoms and tx.  Pt was referred to ENT to discuss these issues she is having headaches and mucosal thickening after MRI was completed. Sent to Dr Anitra Lauth as a FYI on MRI. MRI in chart.

## 2020-02-01 NOTE — Telephone Encounter (Signed)
Patient called in to make sure Dr seen her [employer]. Epic was down at the time and I let patient know I would make sure nurse got the message and would advise Dr of what was going on. Once systems were back up and running I called patient and seen that nurse did indeed respond to patient. I read the message to patient and she stated that she could not come into the office as [place] She thought that since Dr knew her history he would send her in a Z pack. She will just find a minute clinic and go from there.

## 2020-02-01 NOTE — Telephone Encounter (Signed)
I actually was able to see the MRI report from [facility] (brain and neck). Reassure patient that the lymph nodes in her neck are completely normal--NOT anything to worry about. Regarding the sinus abnormalities, these are likely changes from recurrent infections, not necessarily from an acute infection.  Usually with an acute sinus infection, the sinuses have fluid in them, not just mucosal thickening. I recommend she wait until seeing the ENT to see what they think about a trial of antibiotics for this.

## 2020-02-01 NOTE — Telephone Encounter (Signed)
Sent as FYI. 

## 2020-02-01 NOTE — Telephone Encounter (Signed)
Noted  

## 2020-02-02 NOTE — Telephone Encounter (Signed)
PCP sent second message regarding this and gave recommendations. MyChart message sent with recommendations.

## 2020-02-07 DIAGNOSIS — J329 Chronic sinusitis, unspecified: Secondary | ICD-10-CM | POA: Insufficient documentation

## 2020-02-10 ENCOUNTER — Other Ambulatory Visit: Payer: Self-pay | Admitting: Family Medicine

## 2020-03-11 ENCOUNTER — Other Ambulatory Visit: Payer: Self-pay | Admitting: Family Medicine

## 2020-04-11 ENCOUNTER — Encounter: Payer: Self-pay | Admitting: Family Medicine

## 2020-04-12 ENCOUNTER — Other Ambulatory Visit: Payer: Self-pay | Admitting: Family Medicine

## 2020-05-05 ENCOUNTER — Other Ambulatory Visit: Payer: Self-pay | Admitting: Family Medicine

## 2020-06-10 ENCOUNTER — Telehealth: Payer: Self-pay

## 2020-06-10 NOTE — Telephone Encounter (Signed)
Patient was made aware of PCP recommendations.

## 2020-06-10 NOTE — Telephone Encounter (Signed)
Patient called to schedule appt with Dr. Anitra Lauth. She said she had a sinus infection. When I told her that we would have to do visit virtually, because of her symptoms, she said "No, he will want to see me in office.  I have regular sinus infections. I know this is a sinus infection." I asked what her symptoms were: Sinus pressure around and under eyes/  Discharge is "nasty color green,yellow and brown".  She has had symptoms for going on 3 weeks now. Patient has had COVID vaccine.  She also stated that "Dr. Anitra Lauth will want to do labs on me. I asked "labs for a sinus infection?:. She said "No because I am overdue for a visit and I need to get my meds refilled and labs done while I am there."  I told patient I went schedule the appt for Wed. Myself or Vevelyn Royals will call her to let her know if visit is to be in office or a mychart visit.    Patient can be reached at [phone]

## 2020-06-10 NOTE — Telephone Encounter (Signed)
Virtual visit since resp sx's. Any labs or discussion of chronic medical issues will need to be scheduled for a time when she no longer has acute respiratory symptoms. If chronic med RFs are needed she can submit RF requests to her pharmacy and if they need authorization for RF by me I'll assess each request and RF as appropriate.

## 2020-06-10 NOTE — Telephone Encounter (Signed)
Please advise, thanks.

## 2020-06-11 ENCOUNTER — Other Ambulatory Visit: Payer: Self-pay

## 2020-06-12 ENCOUNTER — Encounter: Payer: Self-pay | Admitting: Family Medicine

## 2020-06-12 ENCOUNTER — Telehealth (INDEPENDENT_AMBULATORY_CARE_PROVIDER_SITE_OTHER): Payer: 59 | Admitting: Family Medicine

## 2020-06-12 ENCOUNTER — Other Ambulatory Visit: Payer: Self-pay

## 2020-06-12 VITALS — Wt 190.0 lb

## 2020-06-12 DIAGNOSIS — J0101 Acute recurrent maxillary sinusitis: Secondary | ICD-10-CM | POA: Diagnosis not present

## 2020-06-12 DIAGNOSIS — J329 Chronic sinusitis, unspecified: Secondary | ICD-10-CM

## 2020-06-12 MED ORDER — LEVOFLOXACIN 500 MG PO TABS
500.0000 mg | ORAL_TABLET | Freq: Every day | ORAL | 0 refills | Status: DC
Start: 2020-06-12 — End: 2020-07-24

## 2020-06-12 NOTE — Progress Notes (Signed)
Virtual Visit via Video Note  I connected with pt on 06/12/20 at 10:30 AM EDT by a video enabled telemedicine application and verified that I am speaking with the correct person using two identifiers.  Location patient: home Location provider:work or home office Persons participating in the virtual visit: patient, provider  I discussed the limitations of evaluation and management by telemedicine and the availability of in person appointments. The patient expressed understanding and agreed to proceed.  Telemedicine visit is a necessity given the COVID-19 restrictions in place at the current time.  HPI: 59 y/o WF, hx of recurrent/chronic sinusitis. Lots of green/thick nasal discharge the last 3 wks. Some pain in face and upper teeth.  R maxillary area feels the worst.  Tm 100. Some PND cough.  No ST. No SOB or wheezing.    -COVID-19 vaccine status: she is vaccinated (11/2019, pfizer)  Saw ENT at [facility] 03/2020, CT sinuses apparently showed chronic sinusitis changes and they discussed endoscopic sinus surgery.  However, given the extent of the surgery and the fact that she cannot take ANY pain med she has decided against surgery.  ROS: See pertinent positives and negatives per HPI.  Past Medical History:  Diagnosis Date  . Adverse drug reaction    penicillins, oxycodone, keflex.  Can take 2nd and 3rd gen cephalo's and azith  . Anxiety    after CRPS  . Blood in stool    EGD and colonoscopy w/out source of bleeding 12/2017-->+ hemorrhoids-->GI suspects hemorrhoid dz as cause of blood in stool.  . Chronic pain syndrome    CRPS R shoulder and arm down to hand; cervical DDD/spondylosis, cervical myofascial pain, chronic occip HAs->eval by [facility] 10/2018-->cerv epidural helped x 2wks.  Trigger point inj's helped.Propranolol for HAs no help.  . Chronic post-traumatic stress disorder (PTSD)    + persistent complicated bereavement disorder (Dr. Doroteo Glassman, PhD, psychology).  .  Complex regional pain syndrome    onset with broken R wrist; pain in both arms  . DDD (degenerative disc disease), cervical 06/2018   MRI: moderate to severe C6-7 neural foraminal stenosis, L>R--->to neurosurgeon.  . Depression    anx and dep  . DOE (dyspnea on exertion) 09/05/2018   Stress echo 09/05/18 via [facility].-->NORMAL, EF 55% at rest and with exercise, no ischemia, no valvular abnormalities.  Marland Kitchen Dysphagia   . Frequent headaches    migraine synd + tension HAs  . GAD (generalized anxiety disorder)   . Gastric polyp 12/2017   Hyperplastic->Dr. outlaw considering removal in hosp after severe GERD fully addressed.  Marland Kitchen GERD (gastroesophageal reflux disease)    w/dysphagia and nausea.  Dr. Paulita Fujita: failed multiple PPIs, EGD normal except small hiatal hernia and 12 mm gastric polyp--48H esoph manometry planned as of 02/23/18.  [facility]-->FOURTH opinion 04/05/18: bid dexilant started.  Pt imprvd at f/u 05/31/18  . History of cellulitis    with abscess or oral soft tissues  . Hyperlipidemia   . IBS (irritable bowel syndrome)    Diarrhea, bloating. Labs, endoscopy, and cross sectional imaging all unremarkable ([facility], Dr. Malissa Hippo).  . Idiopathic urticaria    Dr. Carmelina Peal  . Insomnia   . Insulin resistance   . Iron deficiency anemia 12/2017   11/2017.  Pt to get EGD/Colonoscopy by Dr. Paulita Fujita 01/2018.  Pt intolerant of oral iron-->plan for feraheme infusions as of 08/02/18.  . Nephrolithiasis    never had to have one extracted or lithotripsy  . Osteoarthritis   .  Palpitations    PVCs: no medical therpay recommended by her cardiologist in [place], [place].  Marland Kitchen Recurrent UTI    approx 3 per year.  . Thyroid nodule 08/2018   Left lobe 1.8 cm nodule bx'd by [facility] 09/2018-->benign.  Also has left lobe 1.4 cm nodule ->all stabl on u/s 12/2019->f/u w/endo prn.  . Vitamin B12 deficiency    IF ab neg.  Intol B12 inj.  Started B12 oral 01/2018    Past Surgical History:   Procedure Laterality Date  . ABDOMINAL HYSTERECTOMY  age 71   endometriosis; also history of "severely abnormal pap"  . CARDIOVASCULAR STRESS TEST  09/05/2018   Poor sound transmission; definity IV used. Normal Stress ECHO. Trace TR. No prior study with exercise, no ischemic symptoms, ECG changes, wall motion abnormalities  . CESAREAN SECTION     X 2  . CHOLECYSTECTOMY  age 60  . COLONOSCOPY  2012   Normal  . COLONOSCOPY  01/18/2018   Normal: repeat 10 years  . ESOPHAGOGASTRODUODENOSCOPY  01/2018   Hyperplastic gastric polyp-->GI ([facility]) considering polyp removal in hosp.  . LE venous doppler U/s  04/2011   No DVT  . NASAL SINUS SURGERY  age 71  . TONSILLECTOMY  age 89  . UMBILICAL HERNIA REPAIR  age 29  . US THYROID ([place] [facility])  12/08/2019   stable appearance on left thyroid nodule #1     Current Outpatient Medications:  .  dexlansoprazole (DEXILANT) 60 MG capsule, Take 1 capsule (60 mg total) by mouth daily. (Patient taking differently: Take 60 mg by mouth 2 (two) times daily. ), Disp: 90 capsule, Rfl: 1 .  DULoxetine (CYMBALTA) 60 MG capsule, Take 1 capsule (60 mg total) by mouth daily., Disp: 90 capsule, Rfl: 3 .  gabapentin (NEURONTIN) 600 MG tablet, TAKE ONE TABLET (600 MG TOTAL) BY MOUTH 3 (THREE) TIMES A DAY., Disp: , Rfl: 2 .  LORazepam (ATIVAN) 2 MG tablet, 2 tabs po qhs, Disp: 60 tablet, Rfl: 5 .  Multiple Vitamin (MULTIVITAMIN) tablet, Take 1 tablet by mouth daily., Disp: , Rfl:  .  promethazine (PHENERGAN) 25 MG tablet, Take 25 mg by mouth every 6 (six) hours as needed., Disp: , Rfl:  .  simvastatin (ZOCOR) 20 MG tablet, TAKE 1 TABLET BY MOUTH EVERY DAY, Disp: 30 tablet, Rfl: 2 .  tiZANidine (ZANAFLEX) 4 MG tablet, Take 4 mg by mouth once. Muscle relaxant, Disp: , Rfl:  .  rizatriptan (MAXALT) 5 MG tablet, , Disp: , Rfl:   EXAM:  VITALS per patient if applicable:  Vitals with BMI 06/12/2020 01/18/2020 07/26/2019  Height - 5\' 7"  5\' 7"   Weight 190 lbs 190 lbs 3 oz 195  lbs 10 oz  BMI - 85.46 27.03  Systolic - 500 938  Diastolic - 78 87  Pulse - 81 100     GENERAL: alert, oriented, appears well and in no acute distress  HEENT: atraumatic, conjunttiva clear, no obvious abnormalities on inspection of external nose and ears  NECK: normal movements of the head and neck  LUNGS: on inspection no signs of respiratory distress, breathing rate appears normal, no obvious gross SOB, gasping or wheezing  CV: no obvious cyanosis  MS: moves all visible extremities without noticeable abnormality  PSYCH/NEURO: pleasant and cooperative, no obvious depression or anxiety, speech and thought processing grossly intact  LABS: none today    Chemistry      Component Value Date/Time   NA 138 07/26/2019 1505   NA  137 03/15/2014 0000   K 4.4 07/26/2019 1505   CL 102 07/26/2019 1505   CO2 26 07/26/2019 1505   BUN 12 07/26/2019 1505   BUN 13 03/15/2014 0000   CREATININE 0.73 07/26/2019 1505      Component Value Date/Time   CALCIUM 10.2 07/26/2019 1505   ALKPHOS 83 07/26/2019 1505   AST 24 10/02/2019 0854   ALT 30 10/02/2019 0854   BILITOT 0.4 07/26/2019 1505     Lab Results  Component Value Date   WBC 7.4 07/26/2019   HGB 14.8 07/26/2019   HCT 44.3 07/26/2019   MCV 92.3 07/26/2019   PLT 347.0 07/26/2019   Lab Results  Component Value Date   TSH 0.97 07/26/2019   Lab Results  Component Value Date   CHOL 209 (H) 10/02/2019   HDL 52.30 10/02/2019   LDLCALC 138 (H) 10/02/2019   TRIG 94.0 10/02/2019   CHOLHDL 4 10/02/2019   Lab Results  Component Value Date   HGBA1C 5.7 07/26/2019    ASSESSMENT AND PLAN:  Discussed the following assessment and plan:  Acute-on-chronic sinusitis. Multiple antibiotic allergies (penicillins, cephalosporins, macrolides, sulfa). Levaquin 500 mg qd x 14d eRx'd today. If she cannot tolerate this med then we'll try clindamycin. She has decided NOT to pursue repeat sinus surgery.  -we discussed possible serious  and likely etiologies, options for evaluation and workup, limitations of telemedicine visit vs in person visit, treatment, treatment risks and precautions. Pt prefers to treat via telemedicine empirically rather than in person at this moment..   I discussed the assessment and treatment plan with the patient. The patient was provided an opportunity to ask questions and all were answered. The patient agreed with the plan and demonstrated an understanding of the instructions.   F/u: at her earliest convenience for CPE/labs  Signed:  Crissie Sickles, MD           06/12/2020

## 2020-06-17 ENCOUNTER — Encounter: Payer: Self-pay | Admitting: Family Medicine

## 2020-06-17 MED ORDER — AZITHROMYCIN 250 MG PO TABS: ORAL_TABLET | ORAL | 0 refills | Status: DC

## 2020-06-17 NOTE — Telephone Encounter (Signed)
OK to stop levaquin. Pls add clindamycin to her allergy list as "intolerance". I'll eRx Z pack but not two of them.

## 2020-06-17 NOTE — Telephone Encounter (Signed)
Notified pt that Rx was sent to pharmacy

## 2020-06-28 ENCOUNTER — Encounter: Payer: Self-pay | Admitting: Family Medicine

## 2020-06-29 MED ORDER — AZITHROMYCIN 250 MG PO TABS: ORAL_TABLET | ORAL | 0 refills | Status: DC

## 2020-06-29 NOTE — Telephone Encounter (Signed)
Pt aware of Rx sent

## 2020-06-29 NOTE — Telephone Encounter (Signed)
Z-pack eRx'd. If pt not better after this antibiotic then must be seen again before any further med.

## 2020-07-22 ENCOUNTER — Other Ambulatory Visit: Payer: Self-pay | Admitting: Family Medicine

## 2020-07-22 ENCOUNTER — Encounter: Payer: Self-pay | Admitting: Family Medicine

## 2020-07-22 NOTE — Telephone Encounter (Signed)
Pt scheduled for VV.

## 2020-07-22 NOTE — Telephone Encounter (Signed)
Scheduled pt for a VV on 07/24/20. Also changed address in system.

## 2020-07-24 ENCOUNTER — Telehealth (INDEPENDENT_AMBULATORY_CARE_PROVIDER_SITE_OTHER): Payer: 59 | Admitting: Family Medicine

## 2020-07-24 ENCOUNTER — Encounter: Payer: Self-pay | Admitting: Family Medicine

## 2020-07-24 ENCOUNTER — Other Ambulatory Visit: Payer: Self-pay

## 2020-07-24 VITALS — BP 126/82 | Wt 190.0 lb

## 2020-07-24 DIAGNOSIS — J329 Chronic sinusitis, unspecified: Secondary | ICD-10-CM

## 2020-07-24 DIAGNOSIS — F3340 Major depressive disorder, recurrent, in remission, unspecified: Secondary | ICD-10-CM | POA: Diagnosis not present

## 2020-07-24 DIAGNOSIS — F411 Generalized anxiety disorder: Secondary | ICD-10-CM | POA: Diagnosis not present

## 2020-07-24 MED ORDER — LORAZEPAM 2 MG PO TABS: ORAL_TABLET | ORAL | 5 refills | Status: AC

## 2020-07-24 MED ORDER — SIMVASTATIN 20 MG PO TABS: 20.0000 mg | ORAL_TABLET | Freq: Every day | ORAL | 3 refills | Status: AC

## 2020-07-24 NOTE — Progress Notes (Signed)
Virtual Visit via Video Note  I connected with pt on 07/24/20 at  4:00 PM EST by a video enabled telemedicine application and verified that I am speaking with the correct person using two identifiers.  Location patient: home, [place] Location provider:work or home office Persons participating in the virtual visit: patient, provider  I discussed the limitations of evaluation and management by telemedicine and the availability of in person appointments. The patient expressed understanding and agreed to proceed.   HPI: 59 y/o WF being seen today for 6 mo f/u HLD, prediabetes, anc chronic anxiety d/o. A/P as of last visit: "1) GAD, recurrent MDD: Mood is stable but anxiety high lately, life stressors at a max lately. Emotional support/encouragement given.  No change in meds. RF'd lorazepam today.  2) HLD: tolerating simvastatin.  Last lipids reasonable 09/2019. Plan repeat lipids 75mo.  3) Prediabetes: diet and exercise improvements reiterated. Hba1c at her earliest convenience.  4) Chronic pain: c-spine.  This is debilitating, particularly since she cannot tolerate any opioids at all. Upcoming MRI neck and head by her specialists at [facility]."  INTERIM HX: [facility], now in [place], [place]. Sinuses have cleared up completely now. Anxiety pretty stable on lorazepam bid consistently. Inc anxiety about her son's struggles with anxiety. Has been too busy to think about much exercise. Taking cymbalta 60mg  qd.  Still getting f/u at [facility] for chronic pain: some trigger point injections have been helpful.  Taking gabapentin and zanaflex via that provider as well.  PMP AWARE reviewed today: most recent rx for lorazepam 2mg  was filled 06/25/20, # 28, rx by me. No red flags.  ROS: See pertinent positives and negatives per HPI.  Past Medical History:  Diagnosis Date  . Adverse drug reaction    penicillins, oxycodone, keflex.  Can take 2nd and 3rd gen cephalo's and azith  . Anxiety    after  CRPS  . Blood in stool    EGD and colonoscopy w/out source of bleeding 12/2017-->+ hemorrhoids-->GI suspects hemorrhoid dz as cause of blood in stool.  . Chronic pain syndrome    CRPS R shoulder and arm down to hand; cervical DDD/spondylosis, cervical myofascial pain, chronic occip HAs->eval by [facility] 10/2018-->cerv epidural helped x 2wks.  Trigger point inj's helped.Propranolol for HAs no help.  . Chronic post-traumatic stress disorder (PTSD)    + persistent complicated bereavement disorder (Dr. Doroteo Glassman, PhD, psychology).  . Complex regional pain syndrome    onset with broken R wrist; pain in both arms  . DDD (degenerative disc disease), cervical 06/2018   MRI: moderate to severe C6-7 neural foraminal stenosis, L>R--->to neurosurgeon.  . Depression    anx and dep  . DOE (dyspnea on exertion) 09/05/2018   Stress echo 09/05/18 via [facility].-->NORMAL, EF 55% at rest and with exercise, no ischemia, no valvular abnormalities.  Marland Kitchen Dysphagia   . Frequent headaches    migraine synd + tension HAs  . GAD (generalized anxiety disorder)   . Gastric polyp 12/2017   Hyperplastic->Dr. outlaw considering removal in hosp after severe GERD fully addressed.  Marland Kitchen GERD (gastroesophageal reflux disease)    w/dysphagia and nausea.  Dr. Paulita Fujita: failed multiple PPIs, EGD normal except small hiatal hernia and 12 mm gastric polyp--48H esoph manometry planned as of 02/23/18.  [facility]-->FOURTH opinion 04/05/18: bid dexilant started.  Pt imprvd at f/u 05/31/18  . History of cellulitis    with abscess or oral soft tissues  . Hyperlipidemia   .  IBS (irritable bowel syndrome)    Diarrhea, bloating. Labs, endoscopy, and cross sectional imaging all unremarkable ([facility], Dr. Malissa Hippo).  . Idiopathic urticaria    Dr. Carmelina Peal  . Insomnia   . Insulin resistance   . Iron deficiency anemia 12/2017   11/2017.  Pt to get EGD/Colonoscopy by Dr. Paulita Fujita 01/2018.  Pt intolerant of oral iron-->plan  for feraheme infusions as of 08/02/18.  . Nephrolithiasis    never had to have one extracted or lithotripsy  . Osteoarthritis   . Palpitations    PVCs: no medical therpay recommended by her cardiologist in [place], [place].  Marland Kitchen Recurrent UTI    approx 3 per year.  . Thyroid nodule 08/2018   Left lobe 1.8 cm nodule bx'd by [facility] 09/2018-->benign.  Also has left lobe 1.4 cm nodule ->all stabl on u/s 12/2019->f/u w/endo prn.  . Vitamin B12 deficiency    IF ab neg.  Intol B12 inj.  Started B12 oral 01/2018    Past Surgical History:  Procedure Laterality Date  . ABDOMINAL HYSTERECTOMY  age 62   endometriosis; also history of "severely abnormal pap"  . CARDIOVASCULAR STRESS TEST  09/05/2018   Poor sound transmission; definity IV used. Normal Stress ECHO. Trace TR. No prior study with exercise, no ischemic symptoms, ECG changes, wall motion abnormalities  . CESAREAN SECTION     X 2  . CHOLECYSTECTOMY  age 51  . COLONOSCOPY  2012   Normal  . COLONOSCOPY  01/18/2018   Normal: repeat 10 years  . ESOPHAGOGASTRODUODENOSCOPY  01/2018   Hyperplastic gastric polyp-->GI ([facility]) considering polyp removal in hosp.  . LE venous doppler U/s  04/2011   No DVT  . NASAL SINUS SURGERY  age 24  . TONSILLECTOMY  age 28  . UMBILICAL HERNIA REPAIR  age 9  . US THYROID ([place] [facility])  12/08/2019   stable appearance on left thyroid nodule #1     Current Outpatient Medications:  .  dexlansoprazole (DEXILANT) 60 MG capsule, Take 1 capsule (60 mg total) by mouth daily. (Patient taking differently: Take 60 mg by mouth 2 (two) times daily. ), Disp: 90 capsule, Rfl: 1 .  DULoxetine (CYMBALTA) 60 MG capsule, Take 1 capsule (60 mg total) by mouth daily., Disp: 90 capsule, Rfl: 3 .  gabapentin (NEURONTIN) 600 MG tablet, TAKE ONE TABLET (600 MG TOTAL) BY MOUTH 3 (THREE) TIMES A DAY., Disp: , Rfl: 2 .  LORazepam (ATIVAN) 2 MG tablet, 2 tabs po qhs, Disp: 60 tablet, Rfl: 5 .  Multiple Vitamin (MULTIVITAMIN)  tablet, Take 1 tablet by mouth daily., Disp: , Rfl:  .  promethazine (PHENERGAN) 25 MG tablet, Take 25 mg by mouth every 6 (six) hours as needed., Disp: , Rfl:  .  rizatriptan (MAXALT) 5 MG tablet, , Disp: , Rfl:  .  simvastatin (ZOCOR) 20 MG tablet, TAKE 1 TABLET BY MOUTH EVERY DAY, Disp: 30 tablet, Rfl: 2 .  tiZANidine (ZANAFLEX) 4 MG tablet, Take 4 mg by mouth once. Muscle relaxant, Disp: , Rfl:  .  azithromycin (ZITHROMAX) 250 MG tablet, 2 tabs po qd x 1d, then 1 tab po qd x 4d (Patient not taking: Reported on 07/24/2020), Disp: 6 tablet, Rfl: 0 .  levofloxacin (LEVAQUIN) 500 MG tablet, Take 1 tablet (500 mg total) by mouth daily. (Patient not taking: Reported on 07/24/2020), Disp: 14 tablet, Rfl: 0  EXAM:  VITALS per patient if applicable:  Vitals with BMI 07/24/2020 06/12/2020 01/18/2020  Height - - 5'  7"  Weight 190 lbs 190 lbs 190 lbs 3 oz  BMI - - 18.84  Systolic 166 - 063  Diastolic 82 - 78  Pulse - - 81     GENERAL: alert, oriented, appears well and in no acute distress  HEENT: atraumatic, conjunttiva clear, no obvious abnormalities on inspection of external nose and ears  NECK: normal movements of the head and neck  LUNGS: on inspection no signs of respiratory distress, breathing rate appears normal, no obvious gross SOB, gasping or wheezing  CV: no obvious cyanosis  MS: moves all visible extremities without noticeable abnormality  PSYCH/NEURO: pleasant and cooperative, no obvious depression or anxiety, speech and thought processing grossly intact  LABS: none today  Lab Results  Component Value Date   TSH 0.97 07/26/2019   Lab Results  Component Value Date   WBC 7.4 07/26/2019   HGB 14.8 07/26/2019   HCT 44.3 07/26/2019   MCV 92.3 07/26/2019   PLT 347.0 07/26/2019   Lab Results  Component Value Date   CREATININE 0.73 07/26/2019   BUN 12 07/26/2019   NA 138 07/26/2019   K 4.4 07/26/2019   CL 102 07/26/2019   CO2 26 07/26/2019   Lab Results   Component Value Date   ALT 30 10/02/2019   AST 24 10/02/2019   ALKPHOS 83 07/26/2019   BILITOT 0.4 07/26/2019   Lab Results  Component Value Date   CHOL 209 (H) 10/02/2019   Lab Results  Component Value Date   HDL 52.30 10/02/2019   Lab Results  Component Value Date   LDLCALC 138 (H) 10/02/2019   Lab Results  Component Value Date   TRIG 94.0 10/02/2019   Lab Results  Component Value Date   CHOLHDL 4 10/02/2019   Lab Results  Component Value Date   HGBA1C 5.7 07/26/2019   ASSESSMENT AND PLAN:  Discussed the following assessment and plan:  1) GAD and anxiety-related insomnia; stable, coping well with acute-on-chronic life stressors, counseling is helping. Cont loraz 2mg , 2 tabs qhs and cymbalta 60mg  qd. Gennette Pac 2mg , 2 qhs, #60, RF x 5 eRx'd today.  2) Recurrent sinusitis: latest bout with this has resolved.  3) Chronic pain syndrome: fairly stable. Continue with providers at [facility] for this. C-spine DDD/spondylosis->she has neurosurgeon but she is a poor surgical candidate.  4) HLD and prediabetes: due for f/u labs. Continue simvastatin.   I discussed the assessment and treatment plan with the patient. The patient was provided an opportunity to ask questions and all were answered. The patient agreed with the plan and demonstrated an understanding of the instructions.   F/u: cpe with fasting labs in 1-3 mo, pt to call back to schedule  Signed:  Crissie Sickles, MD           07/24/2020

## 2020-12-18 ENCOUNTER — Encounter: Payer: Self-pay | Admitting: Family Medicine

## 2021-01-29 ENCOUNTER — Encounter: Payer: Self-pay | Admitting: Family Medicine

## 2021-01-29 NOTE — Telephone Encounter (Signed)
Please Advise

## 2021-01-29 NOTE — Telephone Encounter (Signed)
Wait another 10 days and then give it a try.
